# Patient Record
Sex: Male | Born: 1975 | Race: Black or African American | Hispanic: No | Marital: Married | Smoking: Never smoker
Health system: Southern US, Community
[De-identification: ages and names within clinical notes are randomized; demographics above are authoritative.]

## PROBLEM LIST (undated history)

## (undated) DIAGNOSIS — I10 Essential (primary) hypertension: Secondary | ICD-10-CM

## (undated) DIAGNOSIS — E119 Type 2 diabetes mellitus without complications: Secondary | ICD-10-CM

## (undated) DIAGNOSIS — G4730 Sleep apnea, unspecified: Secondary | ICD-10-CM

## (undated) DIAGNOSIS — I639 Cerebral infarction, unspecified: Secondary | ICD-10-CM

## (undated) HISTORY — DX: Type 2 diabetes mellitus without complications: E11.9

## (undated) HISTORY — PX: VASCULAR SURGERY: SHX849

## (undated) HISTORY — PX: WISDOM TOOTH EXTRACTION: SHX21

---

## 2004-08-05 ENCOUNTER — Ambulatory Visit: Payer: Self-pay | Admitting: Internal Medicine

## 2007-11-18 ENCOUNTER — Emergency Department (HOSPITAL_COMMUNITY): Admission: EM | Admit: 2007-11-18 | Discharge: 2007-11-18 | Payer: Self-pay | Admitting: Emergency Medicine

## 2009-01-01 ENCOUNTER — Emergency Department: Payer: Self-pay | Admitting: Emergency Medicine

## 2009-06-27 ENCOUNTER — Emergency Department: Payer: Self-pay | Admitting: Emergency Medicine

## 2009-09-05 DIAGNOSIS — I639 Cerebral infarction, unspecified: Secondary | ICD-10-CM

## 2009-09-05 HISTORY — DX: Cerebral infarction, unspecified: I63.9

## 2010-06-27 ENCOUNTER — Emergency Department: Payer: Self-pay | Admitting: Emergency Medicine

## 2010-09-29 ENCOUNTER — Encounter
Admission: RE | Admit: 2010-09-29 | Discharge: 2010-09-29 | Payer: Self-pay | Source: Home / Self Care | Attending: Unknown Physician Specialty | Admitting: Unknown Physician Specialty

## 2011-05-30 LAB — I-STAT 8, (EC8 V) (CONVERTED LAB)
BUN: 11
Chloride: 105
HCT: 47
Hemoglobin: 16
Operator id: 222501
pCO2, Ven: 52.1 — ABNORMAL HIGH

## 2011-05-30 LAB — POCT I-STAT CREATININE
Creatinine, Ser: 1.2
Operator id: 222501

## 2011-05-30 LAB — POCT CARDIAC MARKERS: CKMB, poc: <1 — ABNORMAL LOW

## 2012-05-27 ENCOUNTER — Emergency Department: Payer: Self-pay | Admitting: Emergency Medicine

## 2014-11-14 ENCOUNTER — Emergency Department: Payer: Self-pay | Admitting: Emergency Medicine

## 2015-10-30 ENCOUNTER — Emergency Department
Admission: EM | Admit: 2015-10-30 | Discharge: 2015-10-30 | Disposition: A | Discharge status: Home / Self Care | Payer: 59 | Attending: Emergency Medicine | Admitting: Emergency Medicine

## 2015-10-30 ENCOUNTER — Encounter: Payer: Self-pay | Admitting: Emergency Medicine

## 2015-10-30 ENCOUNTER — Emergency Department: Payer: 59

## 2015-10-30 DIAGNOSIS — R197 Diarrhea, unspecified: Secondary | ICD-10-CM | POA: Diagnosis present

## 2015-10-30 DIAGNOSIS — J069 Acute upper respiratory infection, unspecified: Secondary | ICD-10-CM | POA: Insufficient documentation

## 2015-10-30 DIAGNOSIS — I10 Essential (primary) hypertension: Secondary | ICD-10-CM | POA: Insufficient documentation

## 2015-10-30 DIAGNOSIS — A0811 Acute gastroenteropathy due to Norwalk agent: Secondary | ICD-10-CM | POA: Insufficient documentation

## 2015-10-30 DIAGNOSIS — R112 Nausea with vomiting, unspecified: Secondary | ICD-10-CM

## 2015-10-30 HISTORY — DX: Cerebral infarction, unspecified: I63.9

## 2015-10-30 HISTORY — DX: Essential (primary) hypertension: I10

## 2015-10-30 LAB — RAPID INFLUENZA A&B ANTIGENS (ARMC ONLY): INFLUENZA B (ARMC): NOT DETECTED

## 2015-10-30 LAB — BASIC METABOLIC PANEL
ANION GAP: 8 (ref 5–15)
BUN: 17 mg/dL (ref 6–20)
CALCIUM: 8.3 mg/dL — AB (ref 8.9–10.3)
CHLORIDE: 101 mmol/L (ref 101–111)
CO2: 29 mmol/L (ref 22–32)
CREATININE: 1.05 mg/dL (ref 0.61–1.24)
GFR calc non Af Amer: >60 mL/min (ref >60)
Glucose, Bld: 102 mg/dL — ABNORMAL HIGH (ref 65–99)
Potassium: 3.3 mmol/L — ABNORMAL LOW (ref 3.5–5.1)
SODIUM: 138 mmol/L (ref 135–145)

## 2015-10-30 LAB — CBC
HCT: 39.1 % — ABNORMAL LOW (ref 40.0–52.0)
HEMOGLOBIN: 12.6 g/dL — AB (ref 13.0–18.0)
MCH: 22.3 pg — ABNORMAL LOW (ref 26.0–34.0)
MCHC: 32.2 g/dL (ref 32.0–36.0)
MCV: 69.4 fL — ABNORMAL LOW (ref 80.0–100.0)
PLATELETS: 225 10*3/uL (ref 150–440)
RBC: 5.63 MIL/uL (ref 4.40–5.90)
RDW: 14.8 % — ABNORMAL HIGH (ref 11.5–14.5)
WBC: 5.8 10*3/uL (ref 3.8–10.6)

## 2015-10-30 LAB — RAPID INFLUENZA A&B ANTIGENS: Influenza A (ARMC): NOT DETECTED

## 2015-10-30 LAB — TROPONIN I

## 2015-10-30 MED ORDER — ONDANSETRON 4 MG PO TBDP
4.0000 mg | ORAL_TABLET | Freq: Four times a day (QID) | ORAL | Status: DC | PRN
Start: 2015-10-30 — End: 2018-07-13

## 2015-10-30 MED ORDER — ONDANSETRON 8 MG PO TBDP
8.0000 mg | ORAL_TABLET | Freq: Once | ORAL | Status: AC
  Administered 2015-10-30: 8 mg via ORAL
  Filled 2015-10-30: qty 1

## 2015-10-30 NOTE — ED Notes (Signed)
Pt here with c/o diarrhea, fever, cough with occasional cp that began yesterday. Pt states he has "twinges" of left sided cp when he begins coughing. Pt appears in no distress. VSS.

## 2015-10-30 NOTE — Discharge Instructions (Signed)
Cough, Adult A cough helps to clear your throat and lungs. A cough may last only 2-3 weeks (acute), or it may last longer than 8 weeks (chronic). Many different things can cause a cough. A cough may be a sign of an illness or another medical condition. HOME CARE  Pay attention to any changes in your cough.  Take medicines only as told by your doctor.  If you were prescribed an antibiotic medicine, take it as told by your doctor. Do not stop taking it even if you start to feel better.  Talk with your doctor before you try using a cough medicine.  Drink enough fluid to keep your pee (urine) clear or pale yellow.  If the air is dry, use a cold steam vaporizer or humidifier in your home.  Stay away from things that make you cough at work or at home.  If your cough is worse at night, try using extra pillows to raise your head up higher while you sleep.  Do not smoke, and try not to be around smoke. If you need help quitting, ask your doctor.  Do not have caffeine.  Do not drink alcohol.  Rest as needed. GET HELP IF:  You have new problems (symptoms).  You cough up yellow fluid (pus).  Your cough does not get better after 2-3 weeks, or your cough gets worse.  Medicine does not help your cough and you are not sleeping well.  You have pain that gets worse or pain that is not helped with medicine.  You have a fever.  You are losing weight and you do not know why.  You have night sweats. GET HELP RIGHT AWAY IF:  You cough up blood.  You have trouble breathing.  Your heartbeat is very fast.   This information is not intended to replace advice given to you by your health care provider. Make sure you discuss any questions you have with your health care provider.   Document Released: 05/05/2011 Document Revised: 05/13/2015 Document Reviewed: 10/29/2014 Elsevier Interactive Patient Education 2016 [employer]   Viral Gastroenteritis Viral gastroenteritis is also called  stomach flu. This illness is caused by a certain type of germ (virus). It can cause sudden watery poop (diarrhea) and throwing up (vomiting). This can cause you to lose body fluids (dehydration). This illness usually lasts for 3 to 8 days. It usually goes away on its own. HOME CARE   Drink enough fluids to keep your pee (urine) clear or pale yellow. Drink small amounts of fluids often.  Ask your doctor how to replace body fluid losses (rehydration).  Avoid:  Foods high in sugar.  Alcohol.  Bubbly (carbonated) drinks.  Tobacco.  Juice.  Caffeine drinks.  Very hot or cold fluids.  Fatty, greasy foods.  Eating too much at one time.  Dairy products until 24 to 48 hours after your watery poop stops.  You may eat foods with active cultures (probiotics). They can be found in some yogurts and supplements.  Wash your hands well to avoid spreading the illness.  Only take medicines as told by your doctor. Do not give aspirin to children. Do not take medicines for watery poop (antidiarrheals).  Ask your doctor if you should keep taking your regular medicines.  Keep all doctor visits as told. GET HELP RIGHT AWAY IF:   You cannot keep fluids down.  You do not pee at least once every 6 to 8 hours.  You are short of breath.  You see  blood in your poop or throw up. This may look like coffee grounds.  You have belly (abdominal) pain that gets worse or is just in one small spot (localized).  You keep throwing up or having watery poop.  You have a fever.  The patient is a child younger than 3 months, and he or she has a fever.  The patient is a child older than 3 months, and he or she has a fever and problems that do not go away.  The patient is a child older than 3 months, and he or she has a fever and problems that suddenly get worse.  The patient is a baby, and he or she has no tears when crying. MAKE SURE YOU:   Understand these instructions.  Will watch your  condition.  Will get help right away if you are not doing well or get worse.   This information is not intended to replace advice given to you by your health care provider. Make sure you discuss any questions you have with your health care provider.   Document Released: 02/08/2008 Document Revised: 11/14/2011 Document Reviewed: 06/08/2011 Elsevier Interactive Patient Education [employer]   Your exam is consistent withi a viral stomach flu. You should take the prescription medicine for nausea as needed. Continue to promote fluids to prevent dehydration. Follow-up with [facility] or your provider as needed.

## 2015-10-30 NOTE — ED Notes (Signed)
Discharge instructions reviewed with patient. Patient verbalized understanding. Patient ambulated to lobby without difficulty.   

## 2015-10-30 NOTE — ED Provider Notes (Signed)
[facility] Emergency Department Provider Note ____________________________________________  Time seen: 0909  I have reviewed the triage vital signs and the nursing notes.  HISTORY  Chief Complaint  Diarrhea; Chest Pain; and Fever  HPI Jesus Travis is a 40 y.o. male presents to the ED for evaluation of nausea, vomiting, and diarrhea with onset yesterday. He reports sick contacts at work with similar symptoms. He left work yesterday secondary to increased malaise and on Wednesday noticed a Tmax of 102.23F.He's been dosing Coricidin HBP for symptom relief and fever management. He noticed again elevated fever yesterday of 101F. Since onset he's had a difficult keeping fluids and solids down, including water. He is also noticed some discomfort to his chest associated with this cough and abdominal cramping. He notes his discomfort and pain at a 6/10 in triage. He denies any rashes, shortness of breath, or wheezing.  Past Medical History  Diagnosis Date  . CVA (cerebral infarction) 2011    mild   . Hypertension    There are no active problems to display for this patient.  History reviewed. No pertinent past surgical history.  Current Outpatient Rx  Name  Route  Sig  Dispense  Refill  . ondansetron (ZOFRAN ODT) 4 MG disintegrating tablet   Oral   Take 1 tablet (4 mg total) by mouth every 6 (six) hours as needed for nausea or vomiting.   15 tablet   0    Allergies Review of patient's allergies indicates no known allergies.  No family history on file.  Social History Social History  Substance Use Topics  . Smoking status: Never Smoker   . Smokeless tobacco: None  . Alcohol Use: No   Review of Systems  Constitutional: Positive for fever. Eyes: Negative for visual changes. ENT: Negative for sore throat. Cardiovascular: Positive for chest pain. Respiratory: Negative for shortness of breath. Reports cough. Gastrointestinal: Positive for abdominal  pain, vomiting and diarrhea. Genitourinary: Negative for dysuria. Musculoskeletal: Negative for back pain. Skin: Negative for rash. Neurological: Negative for headaches, focal weakness or numbness. ____________________________________________  PHYSICAL EXAM:  VITAL SIGNS: ED Triage Vitals  Enc Vitals Group     BP 10/30/15 0744 131/74 mmHg     Pulse Rate 10/30/15 0744 97     Resp 10/30/15 0744 18     Temp 10/30/15 0744 99 F (37.2 C)     Temp src --      SpO2 10/30/15 0744 99 %     Weight 10/30/15 0744 240 lb (108.863 kg)     Height 10/30/15 0744 6' (1.829 m)     Head Cir --      Peak Flow --      Pain Score 10/30/15 0744 5     Pain Loc --      Pain Edu? --      Excl. in GC? --    Constitutional: Alert and oriented. Well appearing and in no distress. Head: Normocephalic and atraumatic.      Eyes: Conjunctivae are normal. PERRL. Normal extraocular movements      Ears: Canals clear. TMs intact bilaterally.   Nose: No congestion/rhinorrhea.   Mouth/Throat: Mucous membranes are moist.   Neck: Supple. No thyromegaly. Hematological/Lymphatic/Immunological: No cervical lymphadenopathy. Cardiovascular: Normal rate, regular rhythm.  Respiratory: Normal respiratory effort. No wheezes/rales/rhonchi. Gastrointestinal: Soft and nontender. No distention, rebound, guarding, or organomegaly. Hyperactive bowel sounds. Musculoskeletal: Nontender with normal range of motion in all extremities.  Neurologic:  Normal gait without ataxia. Normal speech and  language. No gross focal neurologic deficits are appreciated. Skin:  Skin is warm, dry and intact. No rash noted. Psychiatric: Mood and affect are normal. Patient exhibits appropriate insight and judgment. ____________________________________________   LABS (pertinent positives/negatives) Labs Reviewed  BASIC METABOLIC PANEL - Abnormal; Notable for the following:    Potassium 3.3 (*)    Glucose, Bld 102 (*)    Calcium 8.3 (*)     All other components within normal limits  CBC - Abnormal; Notable for the following:    Hemoglobin 12.6 (*)    HCT 39.1 (*)    MCV 69.4 (*)    MCH 22.3 (*)    RDW 14.8 (*)    All other components within normal limits  RAPID INFLUENZA A&B ANTIGENS ([facility] ONLY)  TROPONIN I  ____________________________________________  ED ECG REPORT   Date: 10/30/2015  EKG Time: 3:16 PM  Rate: 105  Rhythm: sinus tachycardia,  normal EKG, there are no previous tracings available for comparison  Intervals:none  ST&T Change: none   Narrative Interpretation: sinus tachy, No STEMI ____________________________________________   RADIOLOGY  CXR IMPRESSION: No edema or consolidation. ____________________________________________  PROCEDURES  Zofran 8 mg ODT ____________________________________________  INITIAL IMPRESSION / ASSESSMENT AND PLAN / ED COURSE  Patient with what appears to be an acute viral gastroenteritis that may be also experiencing a viral upper extremity infection. He is found to have a negative influenza screen on exam today. History point is also negative at this time. He appears to have a musculoskeletal chest wall pain that is aggravated by his cough. Patient is otherwise found to have stable vital signs and essentially normal exam today. EKG does not reveal any signs of a STEMI. Patient is discharged with prescription for Zofran to dose as needed for nausea and vomiting. He is also advised to dose over-the-counter medications for cough and congestion relief. A work note is provided for today as requested. He is encouraged to continue to hydrate and return to the ED for intractable symptoms as discussed. ____________________________________________  FINAL CLINICAL IMPRESSION(S) / ED DIAGNOSES  Final diagnoses:  URI (upper respiratory infection)  Nausea vomiting and diarrhea  Viral gastroenteritis due to [place]-like agent      Lissa Hoard, PA-C 10/30/15  1523  Jennye Moccasin, MD 10/30/15 [id number]

## 2016-03-16 ENCOUNTER — Encounter: Payer: Self-pay | Admitting: *Deleted

## 2016-03-16 ENCOUNTER — Emergency Department: Payer: 59

## 2016-03-16 ENCOUNTER — Emergency Department
Admission: EM | Admit: 2016-03-16 | Discharge: 2016-03-16 | Disposition: A | Discharge status: Home / Self Care | Payer: 59 | Attending: Emergency Medicine | Admitting: Emergency Medicine

## 2016-03-16 DIAGNOSIS — Z8673 Personal history of transient ischemic attack (TIA), and cerebral infarction without residual deficits: Secondary | ICD-10-CM | POA: Diagnosis not present

## 2016-03-16 DIAGNOSIS — R079 Chest pain, unspecified: Secondary | ICD-10-CM

## 2016-03-16 DIAGNOSIS — I10 Essential (primary) hypertension: Secondary | ICD-10-CM | POA: Insufficient documentation

## 2016-03-16 DIAGNOSIS — R0789 Other chest pain: Secondary | ICD-10-CM | POA: Insufficient documentation

## 2016-03-16 DIAGNOSIS — Z79899 Other long term (current) drug therapy: Secondary | ICD-10-CM | POA: Diagnosis not present

## 2016-03-16 LAB — BASIC METABOLIC PANEL
Anion gap: 7 (ref 5–15)
BUN: 12 mg/dL (ref 6–20)
CHLORIDE: 104 mmol/L (ref 101–111)
CO2: 28 mmol/L (ref 22–32)
Calcium: 9.3 mg/dL (ref 8.9–10.3)
Creatinine, Ser: 0.88 mg/dL (ref 0.61–1.24)
GFR calc Af Amer: >60 mL/min (ref >60)
GLUCOSE: 90 mg/dL (ref 65–99)
POTASSIUM: 3.4 mmol/L — AB (ref 3.5–5.1)
Sodium: 139 mmol/L (ref 135–145)

## 2016-03-16 LAB — CBC
HEMATOCRIT: 41.6 % (ref 40.0–52.0)
Hemoglobin: 13.5 g/dL (ref 13.0–18.0)
MCH: 22.5 pg — AB (ref 26.0–34.0)
MCHC: 32.4 g/dL (ref 32.0–36.0)
MCV: 69.4 fL — AB (ref 80.0–100.0)
Platelets: 269 10*3/uL (ref 150–440)
RBC: 5.99 MIL/uL — ABNORMAL HIGH (ref 4.40–5.90)
RDW: 14.7 % — AB (ref 11.5–14.5)
WBC: 4.7 10*3/uL (ref 3.8–10.6)

## 2016-03-16 LAB — TROPONIN I: Troponin I: <0.03 ng/mL (ref <0.03)

## 2016-03-16 MED ORDER — FAMOTIDINE 20 MG PO TABS: 20.0000 mg | ORAL_TABLET | Freq: Two times a day (BID) | ORAL | Status: DC

## 2016-03-16 NOTE — ED Notes (Signed)
Pt verbalized understanding of discharge instructions. NAD at this time. 

## 2016-03-16 NOTE — ED Notes (Signed)
Pt  Sent from [facility], pt has had chest pain for the last week, pain occurs after eating and lying down

## 2016-03-16 NOTE — ED Provider Notes (Signed)
[facility] Emergency Department Provider Note        Time seen: ----------------------------------------- 12:09 PM on 03/16/2016 -----------------------------------------    I have reviewed the triage vital signs and the nursing notes.   HISTORY  Chief Complaint Chest Pain    HPI Jesus Travis is a 40 y.o. male who presents to ER for chest pain for the last week. Patient states pain occurs after laying down and eating. He does have some tenderness to his left chest wall. Patient states she's been seen in the ER for this in the distant past. He denies recent illness or other complaints at this time.   Past Medical History  Diagnosis Date  . CVA (cerebral infarction) 2011    mild   . Hypertension     There are no active problems to display for this patient.   History reviewed. No pertinent past surgical history.  Allergies Review of patient's allergies indicates no known allergies.  Social History Social History  Substance Use Topics  . Smoking status: Never Smoker   . Smokeless tobacco: None  . Alcohol Use: No    Review of Systems Constitutional: Negative for fever. Cardiovascular: Positive for chest pain Respiratory: Negative for shortness of breath. Gastrointestinal: Negative for abdominal pain, vomiting and diarrhea. Genitourinary: Negative for dysuria. Musculoskeletal: Negative for back pain. Skin: Negative for rash. Neurological: Negative for headaches, focal weakness or numbness.  10-point ROS otherwise negative.  ____________________________________________   PHYSICAL EXAM:  VITAL SIGNS: ED Triage Vitals  Enc Vitals Group     BP --      Pulse --      Resp --      Temp --      Temp src --      SpO2 --      Weight --      Height --      Head Cir --      Peak Flow --      Pain Score 03/16/16 1147 1     Pain Loc --      Pain Edu? --      Excl. in GC? --    Constitutional: Alert and oriented. Well  appearing and in no distress. Eyes: Conjunctivae are normal. PERRL. Normal extraocular movements. ENT   Head: Normocephalic and atraumatic.   Nose: No congestion/rhinnorhea.   Mouth/Throat: Mucous membranes are moist.   Neck: No stridor. Cardiovascular: Normal rate, regular rhythm. No murmurs, rubs, or gallops. Respiratory: Normal respiratory effort without tachypnea nor retractions. Breath sounds are clear and equal bilaterally. No wheezes/rales/rhonchi. Gastrointestinal: Soft and nontender. Normal bowel sounds Musculoskeletal: Nontender with normal range of motion in all extremities. No lower extremity tenderness nor edema. Neurologic:  Normal speech and language. No gross focal neurologic deficits are appreciated.  Skin:  Skin is warm, dry and intact. No rash noted. Psychiatric: Mood and affect are normal. Speech and behavior are normal.  ____________________________________________  EKG: Interpreted by me. Sinus rhythm with a rate of 78 bpm, normal PR interval, normal QRS, normal QT interval. Normal axis.  ____________________________________________  ED COURSE:  Pertinent labs & imaging results that were available during my care of the patient were reviewed by me and considered in my medical decision making (see chart for details). Patient is in no acute distress, will check basic labs and imaging. Likely GERD symptoms mostly. ____________________________________________    LABS (pertinent positives/negatives)  Labs Reviewed  BASIC METABOLIC PANEL - Abnormal; Notable for the following:  Potassium 3.4 (*)    All other components within normal limits  CBC - Abnormal; Notable for the following:    RBC 5.99 (*)    MCV 69.4 (*)    MCH 22.5 (*)    RDW 14.7 (*)    All other components within normal limits  TROPONIN I    RADIOLOGY  Chest x-ray Is unremarkable ____________________________________________  FINAL ASSESSMENT AND PLAN  Chest pain  Plan:  Patient with labs and imaging as dictated above. Unclear etiology for his symptoms. Much of his symptoms sound GERD related. He'll be placed on Pepcid and encouraged to have close follow-up with cardiology for clearance from a cardiac perspective.   Emily Filbert, MD   Note: This dictation was prepared with Dragon dictation. Any transcriptional errors that result from this process are unintentional   Emily Filbert, MD 03/16/16 1250

## 2016-03-16 NOTE — Discharge Instructions (Signed)
Nonspecific Chest Pain  °Chest pain can be caused by many different conditions. There is always a chance that your pain could be related to something serious, such as a heart attack or a blood clot in your lungs. Chest pain can also be caused by conditions that are not life-threatening. If you have chest pain, it is very important to follow up with your health care provider. °CAUSES  °Chest pain can be caused by: °· Heartburn. °· Pneumonia or bronchitis. °· Anxiety or stress. °· Inflammation around your heart (pericarditis) or lung (pleuritis or pleurisy). °· A blood clot in your lung. °· A collapsed lung (pneumothorax). It can develop suddenly on its own (spontaneous pneumothorax) or from trauma to the chest. °· Shingles infection (varicella-zoster virus). °· Heart attack. °· Damage to the bones, muscles, and cartilage that make up your chest wall. This can include: °· Bruised bones due to injury. °· Strained muscles or cartilage due to frequent or repeated coughing or overwork. °· Fracture to one or more ribs. °· Sore cartilage due to inflammation (costochondritis). °RISK FACTORS  °Risk factors for chest pain may include: °· Activities that increase your risk for trauma or injury to your chest. °· Respiratory infections or conditions that cause frequent coughing. °· Medical conditions or overeating that can cause heartburn. °· Heart disease or family history of heart disease. °· Conditions or health behaviors that increase your risk of developing a blood clot. °· Having had chicken pox (varicella zoster). °SIGNS AND SYMPTOMS °Chest pain can feel like: °· Burning or tingling on the surface of your chest or deep in your chest. °· Crushing, pressure, aching, or squeezing pain. °· Dull or sharp pain that is worse when you move, cough, or take a deep breath. °· Pain that is also felt in your back, neck, shoulder, or arm, or pain that spreads to any of these areas. °Your chest pain may come and go, or it may stay  constant. °DIAGNOSIS °Lab tests or other studies may be needed to find the cause of your pain. Your health care provider may have you take a test called an ambulatory ECG (electrocardiogram). An ECG records your heartbeat patterns at the time the test is performed. You may also have other tests, such as: °· Transthoracic echocardiogram (TTE). During echocardiography, sound waves are used to create a picture of all of the heart structures and to look at how blood flows through your heart. °· Transesophageal echocardiogram (TEE). This is a more advanced imaging test that obtains images from inside your body. It allows your health care provider to see your heart in finer detail. °· Cardiac monitoring. This allows your health care provider to monitor your heart rate and rhythm in real time. °· Holter monitor. This is a portable device that records your heartbeat and can help to diagnose abnormal heartbeats. It allows your health care provider to track your heart activity for several days, if needed. °· Stress tests. These can be done through exercise or by taking medicine that makes your heart beat more quickly. °· Blood tests. °· Imaging tests. °TREATMENT  °Your treatment depends on what is causing your chest pain. Treatment may include: °· Medicines. These may include: °· Acid blockers for heartburn. °· Anti-inflammatory medicine. °· Pain medicine for inflammatory conditions. °· Antibiotic medicine, if an infection is present. °· Medicines to dissolve blood clots. °· Medicines to treat coronary artery disease. °· Supportive care for conditions that do not require medicines. This may include: °· Resting. °· Applying heat   or cold packs to injured areas. °· Limiting activities until pain decreases. °HOME CARE INSTRUCTIONS °· If you were prescribed an antibiotic medicine, finish it all even if you start to feel better. °· Avoid any activities that bring on chest pain. °· Do not use any tobacco products, including  cigarettes, chewing tobacco, or electronic cigarettes. If you need help quitting, ask your health care provider. °· Do not drink alcohol. °· Take medicines only as directed by your health care provider. °· Keep all follow-up visits as directed by your health care provider. This is important. This includes any further testing if your chest pain does not go away. °· If heartburn is the cause for your chest pain, you may be told to keep your head raised (elevated) while sleeping. This reduces the chance that acid will go from your stomach into your esophagus. °· Make lifestyle changes as directed by your health care provider. These may include: °¨ Getting regular exercise. Ask your health care provider to suggest some activities that are safe for you. °¨ Eating a heart-healthy diet. A registered dietitian can help you to learn healthy eating options. °¨ Maintaining a healthy weight. °¨ Managing diabetes, if necessary. °¨ Reducing stress. °SEEK MEDICAL CARE IF: °· Your chest pain does not go away after treatment. °· You have a rash with blisters on your chest. °· You have a fever. °SEEK IMMEDIATE MEDICAL CARE IF:  °· Your chest pain is worse. °· You have an increasing cough, or you cough up blood. °· You have severe abdominal pain. °· You have severe weakness. °· You faint. °· You have chills. °· You have sudden, unexplained chest discomfort. °· You have sudden, unexplained discomfort in your arms, back, neck, or jaw. °· You have shortness of breath at any time. °· You suddenly start to sweat, or your skin gets clammy. °· You feel nauseous or you vomit. °· You suddenly feel light-headed or dizzy. °· Your heart begins to beat quickly, or it feels like it is skipping beats. °These symptoms may represent a serious problem that is an emergency. Do not wait to see if the symptoms will go away. Get medical help right away. Call your local emergency services (911 in the [place] Do not drive yourself to the hospital. °  °This  information is not intended to replace advice given to you by your health care provider. Make sure you discuss any questions you have with your health care provider. °  °Document Released: 06/01/2005 Document Revised: 09/12/2014 Document Reviewed: 03/28/2014 °Elsevier Interactive Patient Education ©2016 [employer] ° °Gastroesophageal Reflux Disease, Adult °Normally, food travels down the esophagus and stays in the stomach to be digested. However, when a person has gastroesophageal reflux disease (GERD), food and stomach acid move back up into the esophagus. When this happens, the esophagus becomes sore and inflamed. Over time, GERD can create small holes (ulcers) in the lining of the esophagus.  °CAUSES °This condition is caused by a problem with the muscle between the esophagus and the stomach (lower esophageal sphincter, or LES). Normally, the LES muscle closes after food passes through the esophagus to the stomach. When the LES is weakened or abnormal, it does not close properly, and that allows food and stomach acid to go back up into the esophagus. The LES can be weakened by certain dietary substances, medicines, and medical conditions, including: °· Tobacco use. °· Pregnancy. °· Having a hiatal hernia. °· Heavy alcohol use. °· Certain foods and beverages, such as coffee,   chocolate, onions, and peppermint. °RISK FACTORS °This condition is more likely to develop in: °· People who have an increased body weight. °· People who have connective tissue disorders. °· People who use NSAID medicines. °SYMPTOMS °Symptoms of this condition include: °· Heartburn. °· Difficult or painful swallowing. °· The feeling of having a lump in the throat. °· A bitter taste in the mouth. °· Bad breath. °· Having a large amount of saliva. °· Having an upset or bloated stomach. °· Belching. °· Chest pain. °· Shortness of breath or wheezing. °· Ongoing (chronic) cough or a night-time cough. °· Wearing away of tooth enamel. °· Weight  loss. °Different conditions can cause chest pain. Make sure to see your health care provider if you experience chest pain. °DIAGNOSIS °Your health care provider will take a medical history and perform a physical exam. To determine if you have mild or severe GERD, your health care provider may also monitor how you respond to treatment. You may also have other tests, including: °· An endoscopy to examine your stomach and esophagus with a small camera. °· A test that measures the acidity level in your esophagus. °· A test that measures how much pressure is on your esophagus. °· A barium swallow or modified barium swallow to show the shape, size, and functioning of your esophagus. °TREATMENT °The goal of treatment is to help relieve your symptoms and to prevent complications. Treatment for this condition may vary depending on how severe your symptoms are. Your health care provider may recommend: °· Changes to your diet. °· Medicine. °· Surgery. °HOME CARE INSTRUCTIONS °Diet °· Follow a diet as recommended by your health care provider. This may involve avoiding foods and drinks such as: °¨ Coffee and tea (with or without caffeine). °¨ Drinks that contain alcohol. °¨ Energy drinks and sports drinks. °¨ Carbonated drinks or sodas. °¨ Chocolate and cocoa. °¨ Peppermint and mint flavorings. °¨ Garlic and onions. °¨ Horseradish. °¨ Spicy and acidic foods, including peppers, chili powder, curry powder, vinegar, hot sauces, and barbecue sauce. °¨ Citrus fruit juices and citrus fruits, such as oranges, lemons, and limes. °¨ Tomato-based foods, such as red sauce, chili, salsa, and pizza with red sauce. °¨ Fried and fatty foods, such as donuts, french fries, potato chips, and high-fat dressings. °¨ High-fat meats, such as hot dogs and fatty cuts of red and white meats, such as rib eye steak, sausage, ham, and bacon. °¨ High-fat dairy items, such as whole milk, butter, and cream cheese. °· Eat small, frequent meals instead of large  meals. °· Avoid drinking large amounts of liquid with your meals. °· Avoid eating meals during the 2-3 hours before bedtime. °· Avoid lying down right after you eat. °· Do not exercise right after you eat. ° General Instructions  °· Pay attention to any changes in your symptoms. °· Take over-the-counter and prescription medicines only as told by your health care provider. Do not take aspirin, ibuprofen, or other NSAIDs unless your health care provider told you to do so. °· Do not use any tobacco products, including cigarettes, chewing tobacco, and e-cigarettes. If you need help quitting, ask your health care provider. °· Wear loose-fitting clothing. Do not wear anything tight around your waist that causes pressure on your abdomen. °· Raise (elevate) the head of your bed 6 inches (15cm). °· Try to reduce your stress, such as with yoga or meditation. If you need help reducing stress, ask your health care provider. °· If you are overweight, reduce your weight to an amount that is   healthy for you. Ask your health care provider for guidance about a safe weight loss goal. °· Keep all follow-up visits as told by your health care provider. This is important. °SEEK MEDICAL CARE IF: °· You have new symptoms. °· You have unexplained weight loss. °· You have difficulty swallowing, or it hurts to swallow. °· You have wheezing or a persistent cough. °· Your symptoms do not improve with treatment. °· You have a hoarse voice. °SEEK IMMEDIATE MEDICAL CARE IF: °· You have pain in your arms, neck, jaw, teeth, or back. °· You feel sweaty, dizzy, or light-headed. °· You have chest pain or shortness of breath. °· You vomit and your vomit looks like blood or coffee grounds. °· You faint. °· Your stool is bloody or black. °· You cannot swallow, drink, or eat. °  °This information is not intended to replace advice given to you by your health care provider. Make sure you discuss any questions you have with your health care provider. °    °Document Released: 06/01/2005 Document Revised: 05/13/2015 Document Reviewed: 12/17/2014 °Elsevier Interactive Patient Education ©2016 [employer] ° °

## 2017-06-22 ENCOUNTER — Encounter: Payer: Self-pay | Admitting: Emergency Medicine

## 2017-06-22 ENCOUNTER — Emergency Department
Admission: EM | Admit: 2017-06-22 | Discharge: 2017-06-22 | Disposition: A | Discharge status: Home / Self Care | Payer: 59 | Attending: Emergency Medicine | Admitting: Emergency Medicine

## 2017-06-22 ENCOUNTER — Emergency Department: Payer: 59

## 2017-06-22 DIAGNOSIS — Z79899 Other long term (current) drug therapy: Secondary | ICD-10-CM | POA: Diagnosis not present

## 2017-06-22 DIAGNOSIS — R0789 Other chest pain: Secondary | ICD-10-CM | POA: Diagnosis not present

## 2017-06-22 DIAGNOSIS — R079 Chest pain, unspecified: Secondary | ICD-10-CM | POA: Diagnosis present

## 2017-06-22 DIAGNOSIS — Z8673 Personal history of transient ischemic attack (TIA), and cerebral infarction without residual deficits: Secondary | ICD-10-CM | POA: Diagnosis not present

## 2017-06-22 DIAGNOSIS — I10 Essential (primary) hypertension: Secondary | ICD-10-CM | POA: Insufficient documentation

## 2017-06-22 LAB — BASIC METABOLIC PANEL
ANION GAP: 10 (ref 5–15)
BUN: 24 mg/dL — AB (ref 6–20)
CALCIUM: 9.5 mg/dL (ref 8.9–10.3)
CO2: 29 mmol/L (ref 22–32)
Chloride: 101 mmol/L (ref 101–111)
Creatinine, Ser: 0.94 mg/dL (ref 0.61–1.24)
GFR calc Af Amer: >60 mL/min (ref >60)
Glucose, Bld: 89 mg/dL (ref 65–99)
POTASSIUM: 3.8 mmol/L (ref 3.5–5.1)
SODIUM: 140 mmol/L (ref 135–145)

## 2017-06-22 LAB — TROPONIN I

## 2017-06-22 LAB — CBC
HEMATOCRIT: 39.9 % — AB (ref 40.0–52.0)
Hemoglobin: 12.7 g/dL — ABNORMAL LOW (ref 13.0–18.0)
MCH: 22.4 pg — ABNORMAL LOW (ref 26.0–34.0)
MCHC: 31.9 g/dL — ABNORMAL LOW (ref 32.0–36.0)
MCV: 70.2 fL — ABNORMAL LOW (ref 80.0–100.0)
Platelets: 304 10*3/uL (ref 150–440)
RBC: 5.69 MIL/uL (ref 4.40–5.90)
RDW: 14.5 % (ref 11.5–14.5)
WBC: 5.1 10*3/uL (ref 3.8–10.6)

## 2017-06-22 MED ORDER — ASPIRIN 81 MG PO CHEW
162.0000 mg | CHEWABLE_TABLET | Freq: Once | ORAL | Status: AC
  Administered 2017-06-22: 162 mg via ORAL
  Filled 2017-06-22: qty 2

## 2017-06-22 NOTE — ED Provider Notes (Signed)
[facility] Emergency Department Provider Note  ____________________________________________   First MD Initiated Contact with Patient 06/22/17 1901     (approximate)  I have reviewed the triage vital signs and the nursing notes.   HISTORY  Chief Complaint Chest Pain    HPI Jesus Travis is a 41 y.o. male history of presents to the emergency department withseveral weeks of daily chest pain and some shortness of breath. The pain seems to be worse with deep inspiration is mild to moderate in his left chest. It is nonexertional. It does radiate to his left shoulder. He's never been nauseated. He has never become diaphoretic. He's had no leg swelling. No hemoptysis. No history of deep vein thrombosis or pulmonary embolism. He is not a smoker and he has never smoked. He does have a family history of hypertension.   Past Medical History:  Diagnosis Date  . CVA (cerebral infarction) 2011   mild   . Hypertension     There are no active problems to display for this patient.   No past surgical history on file.  Prior to Admission medications   Medication Sig Start Date End Date Taking? Authorizing Provider  lisinopril-hydrochlorothiazide (PRINZIDE,ZESTORETIC) 20-25 MG tablet Take 1 tablet by mouth daily. 06/19/17  Yes [provider]  sertraline (ZOLOFT) 25 MG tablet Take 25 mg by mouth daily. 12/22/16 12/22/17 Yes [provider]  famotidine (PEPCID) 20 MG tablet Take 1 tablet (20 mg total) by mouth 2 (two) times daily. Patient not taking: Reported on 06/22/2017 03/16/16   Emily FilbertWilliams, Jonathan E, MD  ondansetron (ZOFRAN ODT) 4 MG disintegrating tablet Take 1 tablet (4 mg total) by mouth every 6 (six) hours as needed for nausea or vomiting. Patient not taking: Reported on 06/22/2017 10/30/15   Menshew, Charlesetta IvoryJenise V Bacon, PA-C    Allergies Patient has no known allergies.  No family history on file.  Social History Social History    Substance Use Topics  . Smoking status: Never Smoker  . Smokeless tobacco: Not on file  . Alcohol use No    Review of Systems Constitutional: No fever/chills Eyes: No visual changes. ENT: No sore throat. Cardiovascular: positive for chest pain. Respiratory: Denies shortness of breath. Gastrointestinal: No abdominal pain.  No nausea, no vomiting.  No diarrhea.  No constipation. Genitourinary: Negative for dysuria. Musculoskeletal: Negative for back pain. Skin: Negative for rash. Neurological: Negative for headaches, focal weakness or numbness.   ____________________________________________   PHYSICAL EXAM:  VITAL SIGNS: ED Triage Vitals [06/22/17 1730]  Enc Vitals Group     BP 126/82     Pulse Rate 90     Resp 18     Temp 98.8 F (37.1 C)     Temp Source Oral     SpO2 100 %     Weight 235 lb (106.6 kg)     Height 6' (1.829 m)     Head Circumference      Peak Flow      Pain Score 4     Pain Loc      Pain Edu?      Excl. in GC?     Constitutional: alert and oriented 4 well appearing nontoxic no diaphoresis speaks in full sentences Eyes: PERRL EOMI. Head: Atraumatic. Nose: No congestion/rhinnorhea. Mouth/Throat: No trismus Neck: No stridor.   Cardiovascular: Normal rate, regular rhythm. Grossly normal heart sounds.  Good peripheral circulation. Respiratory: Normal respiratory effort.  No retractions. Lungs CTAB and moving good air Gastrointestinal:  soft nontender Musculoskeletal: No lower extremity edema   Neurologic:  Normal speech and language. No gross focal neurologic deficits are appreciated. Skin:  Skin is warm, dry and intact. No rash noted. Psychiatric: Mood and affect are normal. Speech and behavior are normal.    ____________________________________________   DIFFERENTIAL includes but not limited to  acute coronary syndrome, stable angina, pulmonary lives in, aortic dissection, pericarditis, musculoskeletal  pain ____________________________________________   LABS (all labs ordered are listed, but only abnormal results are displayed)  Labs Reviewed  BASIC METABOLIC PANEL - Abnormal; Notable for the following:       Result Value   BUN 24 (*)    All other components within normal limits  CBC - Abnormal; Notable for the following:    Hemoglobin 12.7 (*)    HCT 39.9 (*)    MCV 70.2 (*)    MCH 22.4 (*)    MCHC 31.9 (*)    All other components within normal limits  TROPONIN I  TROPONIN I    blood work reviewed and interpreted by me shows no acute ischemia 2 __________________________________________  EKG  ED ECG REPORT I, Merrily Brittle, the attending physician, personally viewed and interpreted this ECG.  Date: 06/22/2017 EKG Time:  Rate: 94 Rhythm: normal sinus rhythm QRS Axis: normal Intervals: normal ST/T Wave abnormalities: normal Narrative Interpretation: no evidence of acute ischemia  ____________________________________________  RADIOLOGY  chest x-ray reviewed by me shows no acute disease ____________________________________________   PROCEDURES  Procedure(s) performed: no  Procedures  Critical Care performed: no  Observation: yes  ----------------------------------------- 7:00 PM on 06/22/2017 -----------------------------------------   OBSERVATION CARE: This patient is being placed under observation care for the following reasons: Chest pain with repeat testing to rule out ischemia    ____________________________________________   INITIAL IMPRESSION / ASSESSMENT AND PLAN / ED COURSE  Pertinent labs & imaging results that were available during my care of the patient were reviewed by me and considered in my medical decision making (see chart for details).       The patient is very well-appearing with a heart score of 2 pending his second troponin.  ----------------------------------------- 20:08 PM on  06/22/2017 ----------------------------------------- The patient's pain is now resolved. Pending second troponin.  ____________________________________________  ----------------------------------------- 9:33 PM on 06/22/2017 -----------------------------------------   END OF OBSERVATION STATUS: After an appropriate period of observation, this patient is being discharged due to the following reason(s):  the patient's second troponin is negative and he has no signs of acute ischemia. His heart score is 2. He has already seen Dr. Welton Flakes in the past for an unrelated issue swell refer him back for further risk stratification. The patient verbalizes understanding and agreement with plan.   FINAL CLINICAL IMPRESSION(S) / ED DIAGNOSES  Final diagnoses:  Atypical chest pain      NEW MEDICATIONS STARTED DURING THIS VISIT:  Discharge Medication List as of 06/22/2017  9:33 PM       Note:  This document was prepared using Dragon voice recognition software and may include unintentional dictation errors.     Merrily Brittle, MD 06/23/17 [phone]

## 2017-06-22 NOTE — ED Triage Notes (Signed)
Pt reports left side chest pain that radiates to his back with associated SOB, dizziness, and lightheadedness that began today. Pt ambulatory to triage. No apparent distress noted.

## 2017-06-22 NOTE — Discharge Instructions (Signed)
Fortunately today your blood work was reassuring and he did not have a heart attack. This does not mean that this was not heart pain. Please take ibuprofen as needed for discomfort now but make sure you follow up with Dr. Welton Flakes of Cardiology for a recheck.  Return to the ED for any concerns.  It was a pleasure to take care of you today, and thank you for coming to our emergency department.  If you have any questions or concerns before leaving please ask the nurse to grab me and I'm more than happy to go through your aftercare instructions again.  If you were prescribed any opioid pain medication today such as Norco, Vicodin, Percocet, morphine, hydrocodone, or oxycodone please make sure you do not drive when you are taking this medication as it can alter your ability to drive safely.  If you have any concerns once you are home that you are not improving or are in fact getting worse before you can make it to your follow-up appointment, please do not hesitate to call 911 and come back for further evaluation.  Merrily Brittle, MD  Results for orders placed or performed during the hospital encounter of 06/22/17  Basic metabolic panel  Result Value Ref Range   Sodium 140 135 - 145 mmol/L   Potassium 3.8 3.5 - 5.1 mmol/L   Chloride 101 101 - 111 mmol/L   CO2 29 22 - 32 mmol/L   Glucose, Bld 89 65 - 99 mg/dL   BUN 24 (H) 6 - 20 mg/dL   Creatinine, Ser 8.11 0.61 - 1.24 mg/dL   Calcium 9.5 8.9 - 03.1 mg/dL   GFR calc non Af Amer >60 >60 mL/min   GFR calc Af Amer >60 >60 mL/min   Anion gap 10 5 - 15  CBC  Result Value Ref Range   WBC 5.1 3.8 - 10.6 K/uL   RBC 5.69 4.40 - 5.90 MIL/uL   Hemoglobin 12.7 (L) 13.0 - 18.0 g/dL   HCT 59.4 (L) 58.5 - 92.9 %   MCV 70.2 (L) 80.0 - 100.0 fL   MCH 22.4 (L) 26.0 - 34.0 pg   MCHC 31.9 (L) 32.0 - 36.0 g/dL   RDW 24.4 62.8 - 63.8 %   Platelets 304 150 - 440 K/uL  Troponin I  Result Value Ref Range   Troponin I <0.03 <0.03 ng/mL  Troponin I  Result Value  Ref Range   Troponin I <0.03 <0.03 ng/mL   Dg Chest 2 View  Result Date: 06/22/2017 CLINICAL DATA:  Chest pain EXAM: CHEST  2 VIEW COMPARISON:  03/16/2016 FINDINGS: The heart size and mediastinal contours are within normal limits. Both lungs are clear. The visualized skeletal structures are unremarkable. IMPRESSION: No active cardiopulmonary disease. Electronically Signed   By: Jasmine Pang M.D.   On: 06/22/2017 18:44

## 2017-09-11 ENCOUNTER — Encounter (INDEPENDENT_AMBULATORY_CARE_PROVIDER_SITE_OTHER): Payer: Self-pay | Admitting: Vascular Surgery

## 2017-09-11 ENCOUNTER — Ambulatory Visit (INDEPENDENT_AMBULATORY_CARE_PROVIDER_SITE_OTHER): Payer: 59 | Admitting: Vascular Surgery

## 2017-09-11 VITALS — BP 129/79 | HR 98 | Resp 17 | Ht 72.0 in | Wt 246.2 lb

## 2017-09-11 DIAGNOSIS — I10 Essential (primary) hypertension: Secondary | ICD-10-CM | POA: Diagnosis not present

## 2017-09-11 DIAGNOSIS — I83813 Varicose veins of bilateral lower extremities with pain: Secondary | ICD-10-CM

## 2017-09-11 DIAGNOSIS — R600 Localized edema: Secondary | ICD-10-CM | POA: Diagnosis not present

## 2017-09-11 NOTE — Progress Notes (Signed)
Subjective:    Patient ID: Jesus Travis, male    DOB: 16-Jul-1976, 42 y.o.   MRN: [id number] Chief Complaint  Patient presents with  . New Patient (Initial Visit)    ref Burnett Sheng for Varicose Vein   The patient presents as a new patient referred by Dr. Lockie Pares for evaluation of painful varicose veins.  The patient endorses a long-standing history of varicosities located to the bilateral lower extremity which caused him discomfort.  The patient notes this discomfort as a stinging and burning along his varicosities.  The patient also notes his varicosities have become bigger over time.  The patient also experiences bilateral lower extremity edema.  The patient notes his discomfort and edema worsens towards the end of the day or with prolonged standing or sitting.  The patient's symptoms have progressed to the point he is unable to function on a daily basis.  His symptoms have become lifestyle limiting this is what prompted him to seek medical attention.  The patient denies any trauma surgery to the bilateral lower extremity.  The patient's left lower extremity symptoms are worse in his right.  The patient denies any claudication-like symptoms, rest pain or ulceration to the lower extremities.  The patient does not engage in conservative therapy at this time.  The patient denies any fever, nausea or vomiting.   Review of Systems  Constitutional: Negative.   HENT: Negative.   Eyes: Negative.   Respiratory: Negative.   Cardiovascular:       Painful varicose veins Lateral lower extremity edema  Gastrointestinal: Negative.   Endocrine: Negative.   Genitourinary: Negative.   Musculoskeletal: Negative.   Skin: Negative.   Allergic/Immunologic: Negative.   Neurological: Negative.   Hematological: Negative.   Psychiatric/Behavioral: Negative.       Objective:   Physical Exam  Constitutional: He is oriented to person, place, and time. He appears well-developed and well-nourished. No  distress.  HENT:  Head: Normocephalic and atraumatic.  Eyes: Conjunctivae are normal. Pupils are equal, round, and reactive to light.  Neck: Normal range of motion.  Cardiovascular: Normal rate, regular rhythm, normal heart sounds and intact distal pulses.  Pulses:      Radial pulses are 2+ on the right side, and 2+ on the left side.       Dorsalis pedis pulses are 2+ on the right side, and 2+ on the left side.       Posterior tibial pulses are 2+ on the right side, and 2+ on the left side.  Pulmonary/Chest: Effort normal and breath sounds normal.  Musculoskeletal: Normal range of motion. He exhibits edema (Mild nonpitting edema noted bilaterally).  Neurological: He is alert and oriented to person, place, and time.  Skin: He is not diaphoretic.  Mixture of greater than and less than 1 cm varicosities noted to the bilateral lower extremity.  There is no stasis dermatitis, skin changes or cellulitis noted bilaterally.  Skin is intact.  Psychiatric: He has a normal mood and affect. His behavior is normal. Judgment and thought content normal.  Vitals reviewed.  BP 129/79 (BP Location: Right Arm)   Pulse 98   Resp 17   Ht 6' (1.829 m)   Wt 246 lb 3.2 oz (111.7 kg)   BMI 33.39 kg/m   Past Medical History:  Diagnosis Date  . CVA (cerebral infarction) 2011   mild   . Hypertension    Social History   Socioeconomic History  . Marital status: Married    Spouse  name: Not on file  . Number of children: Not on file  . Years of education: Not on file  . Highest education level: Not on file  Social Needs  . Financial resource strain: Not on file  . Food insecurity - worry: Not on file  . Food insecurity - inability: Not on file  . Transportation needs - medical: Not on file  . Transportation needs - non-medical: Not on file  Occupational History  . Not on file  Tobacco Use  . Smoking status: Never Smoker  . Smokeless tobacco: Never Used  Substance and Sexual Activity  . Alcohol  use: No  . Drug use: No  . Sexual activity: Not on file  Other Topics Concern  . Not on file  Social History Narrative  . Not on file   Past Surgical History:  Procedure Laterality Date  . WISDOM TOOTH EXTRACTION     Family History  Problem Relation Age of Onset  . Hypertension Mother   . Stroke Mother   . Hypertension Father   . Stroke Maternal Grandmother    No Known Allergies     Assessment & Plan:  The patient presents as a new patient referred by Dr. Lockie Pares for evaluation of painful varicose veins.  The patient endorses a long-standing history of varicosities located to the bilateral lower extremity which caused him discomfort.  The patient notes this discomfort as a stinging and burning along his varicosities.  The patient also notes his varicosities have become bigger over time.  The patient also experiences bilateral lower extremity edema.  The patient notes his discomfort and edema worsens towards the end of the day or with prolonged standing or sitting.  The patient's symptoms have progressed to the point he is unable to function on a daily basis.  His symptoms have become lifestyle limiting this is what prompted him to seek medical attention.  The patient denies any trauma surgery to the bilateral lower extremity.  The patient's left lower extremity symptoms are worse in his right.  The patient denies any claudication-like symptoms, rest pain or ulceration to the lower extremities.  The patient does not engage in conservative therapy at this time.  The patient denies any fever, nausea or vomiting.  1. Varicose veins of both lower extremities with pain - New The patient was encouraged to wear graduated compression stockings (20-30 mmHg) on a daily basis. The patient was instructed to begin wearing the stockings first thing in the morning and removing them in the evening. The patient was instructed specifically not to sleep in the stockings. Prescription given.  In addition,  behavioral modification including elevation during the day will be initiated. Anti-inflammatories for pain. Information on chronic venous insufficiency and compression stockings was given to the patient. The patient was instructed to call the office in the interim if any worsening edema or ulcerations to the legs, feet or toes occurs. The patient expresses their understanding.  - VAS [place] LOWER EXTREMITY VENOUS REFLUX; Future  2. Bilateral lower extremity edema - New As above  3. Essential hypertension - Stable Encouraged good control as its slows the progression of atherosclerotic disease  Current Outpatient Medications on File Prior to Visit  Medication Sig Dispense Refill  . lisinopril-hydrochlorothiazide (PRINZIDE,ZESTORETIC) 20-25 MG tablet Take 1 tablet by mouth daily.    . sertraline (ZOLOFT) 25 MG tablet Take 25 mg by mouth daily.    . famotidine (PEPCID) 20 MG tablet Take 1 tablet (20 mg total) by mouth 2 (  two) times daily. (Patient not taking: Reported on 09/11/2017) 60 tablet 1  . ondansetron (ZOFRAN ODT) 4 MG disintegrating tablet Take 1 tablet (4 mg total) by mouth every 6 (six) hours as needed for nausea or vomiting. (Patient not taking: Reported on 09/11/2017) 15 tablet 0   No current facility-administered medications on file prior to visit.    There are no Patient Instructions on file for this visit. No Follow-up on file.  Lessly Stigler A Makhia Vosler, PA-C

## 2017-12-11 ENCOUNTER — Ambulatory Visit (INDEPENDENT_AMBULATORY_CARE_PROVIDER_SITE_OTHER): Payer: 59

## 2017-12-11 ENCOUNTER — Ambulatory Visit (INDEPENDENT_AMBULATORY_CARE_PROVIDER_SITE_OTHER): Payer: 59 | Admitting: Vascular Surgery

## 2017-12-11 ENCOUNTER — Encounter (INDEPENDENT_AMBULATORY_CARE_PROVIDER_SITE_OTHER): Payer: Self-pay | Admitting: Vascular Surgery

## 2017-12-11 DIAGNOSIS — K219 Gastro-esophageal reflux disease without esophagitis: Secondary | ICD-10-CM

## 2017-12-11 DIAGNOSIS — I83813 Varicose veins of bilateral lower extremities with pain: Secondary | ICD-10-CM

## 2017-12-11 DIAGNOSIS — I872 Venous insufficiency (chronic) (peripheral): Secondary | ICD-10-CM | POA: Diagnosis not present

## 2017-12-11 DIAGNOSIS — I890 Lymphedema, not elsewhere classified: Secondary | ICD-10-CM

## 2017-12-11 DIAGNOSIS — I10 Essential (primary) hypertension: Secondary | ICD-10-CM

## 2017-12-16 ENCOUNTER — Encounter (INDEPENDENT_AMBULATORY_CARE_PROVIDER_SITE_OTHER): Payer: Self-pay | Admitting: Vascular Surgery

## 2017-12-16 DIAGNOSIS — I10 Essential (primary) hypertension: Secondary | ICD-10-CM | POA: Insufficient documentation

## 2017-12-16 DIAGNOSIS — I890 Lymphedema, not elsewhere classified: Secondary | ICD-10-CM | POA: Insufficient documentation

## 2017-12-16 DIAGNOSIS — I872 Venous insufficiency (chronic) (peripheral): Secondary | ICD-10-CM | POA: Insufficient documentation

## 2017-12-16 DIAGNOSIS — K219 Gastro-esophageal reflux disease without esophagitis: Secondary | ICD-10-CM | POA: Insufficient documentation

## 2017-12-16 DIAGNOSIS — I83813 Varicose veins of bilateral lower extremities with pain: Secondary | ICD-10-CM | POA: Insufficient documentation

## 2017-12-16 NOTE — Progress Notes (Signed)
MRN : [id number]  Jesus Travis is a 42 y.o. (1976/03/19) male who presents with chief complaint of  Chief Complaint  Patient presents with  . Follow-up    3 month reflux  .  History of Present Illness: The patient returns for followup evaluation 3 months after the initial visit. The patient continues to have pain in the lower extremities with dependency. The pain is lessened with elevation. Graduated compression stockings, Class I (20-30 mmHg), have been worn but the stockings do not eliminate the leg pain. Over-the-counter analgesics do not improve the symptoms. The degree of discomfort continues to interfere with daily activities. The patient notes the pain in the legs is causing problems with daily exercise, at the workplace and even with household activities and maintenance such as standing in the kitchen preparing meals and doing dishes.   Venous ultrasound shows normal deep venous system, no evidence of acute or chronic DVT.  Superficial reflux is present in the GSV and SSV bilaterally.   Current Meds  Medication Sig  . famotidine (PEPCID) 20 MG tablet Take 1 tablet (20 mg total) by mouth 2 (two) times daily.  Marland Kitchen lisinopril-hydrochlorothiazide (PRINZIDE,ZESTORETIC) 20-25 MG tablet Take 1 tablet by mouth daily.    Past Medical History:  Diagnosis Date  . CVA (cerebral infarction) 2011   mild   . Hypertension     Past Surgical History:  Procedure Laterality Date  . WISDOM TOOTH EXTRACTION      Social History Social History   Tobacco Use  . Smoking status: Never Smoker  . Smokeless tobacco: Never Used  Substance Use Topics  . Alcohol use: No  . Drug use: No    Family History Family History  Problem Relation Age of Onset  . Hypertension Mother   . Stroke Mother   . Hypertension Father   . Stroke Maternal Grandmother     No Known Allergies   REVIEW OF SYSTEMS (Negative unless checked)  Constitutional: [] Weight loss  [] Fever  [] Chills Cardiac: [] Chest  pain   [] Chest pressure   [] Palpitations   [] Shortness of breath when laying flat   [] Shortness of breath with exertion. Vascular:  [] Pain in legs with walking   [x] Pain in legs with standing [] History of DVT   [] Phlebitis   [x] Swelling in legs   [x] Varicose veins   [] Non-healing ulcers Pulmonary:   [] Uses home oxygen   [] Productive cough   [] Hemoptysis   [] Wheeze  [] COPD   [] Asthma Neurologic:  [] Dizziness   [] Seizures   [] History of stroke   [] History of TIA  [] Aphasia   [] Vissual changes   [] Weakness or numbness in arm   [] Weakness or numbness in leg Musculoskeletal:   [] Joint swelling   [x] Joint pain   [] Low back pain Hematologic:  [] Easy bruising  [] Easy bleeding   [] Hypercoagulable state   [] Anemic Gastrointestinal:  [] Diarrhea   [] Vomiting  [] Gastroesophageal reflux/heartburn   [] Difficulty swallowing. Genitourinary:  [] Chronic kidney disease   [] Difficult urination  [] Frequent urination   [] Blood in urine Skin:  [] Rashes   [] Ulcers  Psychological:  [] History of anxiety   []  History of major depression.  Physical Examination  Vitals:   12/11/17 1605  BP: 118/74  Pulse: 98  Resp: 12  Weight: 246 lb (111.6 kg)  Height: 6' (1.829 m)   Body mass index is 33.36 kg/m. Gen: WD/WN, NAD Head: [place]/AT, No temporalis wasting.  Ear/Nose/Throat: Hearing grossly intact, nares w/o erythema or drainage Eyes: PER, EOMI, sclera nonicteric.  Neck: Supple,  no large masses.   Pulmonary:  Good air movement, no audible wheezing bilaterally, no use of accessory muscles.  Cardiac: RRR, no JVD Vascular: Large varicosities present extensively greater than 10 mm bilaterally.  Moderate venous stasis changes to the legs bilaterally.  2-3+ soft pitting edema Vessel Right Left  Radial Palpable Palpable  PT Palpable Palpable  DP Palpable Palpable  Gastrointestinal: Non-distended. No guarding/no peritoneal signs.  Musculoskeletal: M/S 5/5 throughout.  No deformity or atrophy.  Neurologic: CN 2-12 intact.  Symmetrical.  Speech is fluent. Motor exam as listed above. Psychiatric: Judgment intact, Mood & affect appropriate for pt's clinical situation. Dermatologic: Venous rashes no ulcers noted.  No changes consistent with cellulitis. Lymph : No lichenification or skin changes of chronic lymphedema.  CBC Lab Results  Component Value Date   WBC 5.1 06/22/2017   HGB 12.7 (L) 06/22/2017   HCT 39.9 (L) 06/22/2017   MCV 70.2 (L) 06/22/2017   PLT 304 06/22/2017    BMET    Component Value Date/Time   NA 140 06/22/2017 1731   K 3.8 06/22/2017 1731   CL 101 06/22/2017 1731   CO2 29 06/22/2017 1731   GLUCOSE 89 06/22/2017 1731   BUN 24 (H) 06/22/2017 1731   CREATININE 0.94 06/22/2017 1731   CALCIUM 9.5 06/22/2017 1731   GFRNONAA >60 06/22/2017 1731   GFRAA >60 06/22/2017 1731   CrCl cannot be calculated (Patient's most recent lab result is older than the maximum 21 days allowed.).  COAG No results found for: INR, PROTIME  Radiology No results found.    Assessment/Plan 1. Varicose veins of both lower extremities with pain Recommend  I have reviewed my previous  discussion with the patient regarding  varicose veins and why they cause symptoms. Patient will continue  wearing graduated compression stockings class 1 on a daily basis, beginning first thing in the morning and removing them in the evening.    In addition, behavioral modification including elevation during the day was again discussed and this will continue.  The patient has utilized over the counter pain medications and has been exercising.  However, at this time conservative therapy has not alleviated the patient's symptoms of leg pain and swelling  Recommend: laser ablation of the right and  left great saphenous veins to eliminate the symptoms of pain and swelling of the lower extremities caused by the severe superficial venous reflux disease.   The patient wishes to move forward with the left leg at this time.   Therefore the laser of the great and small saphenous vein of the left lower extremity will be performed and we will see if the right leg is to be treated after completion of the left leg.  This is by the patient's preference.  2. Chronic venous insufficiency No surgery or intervention at this point in time.    I have had a long discussion with the patient regarding venous insufficiency and why it  causes symptoms. I have discussed with the patient the chronic skin changes that accompany venous insufficiency and the long term sequela such as infection and ulceration.  Patient will begin wearing graduated compression stockings class 1 (20-30 mmHg) or compression wraps on a daily basis a prescription was given. The patient will put the stockings on first thing in the morning and removing them in the evening. The patient is instructed specifically not to sleep in the stockings.    In addition, behavioral modification including several periods of elevation of the lower extremities during  the day will be continued. I have demonstrated that proper elevation is a position with the ankles at heart level.  The patient is instructed to begin routine exercise, especially walking on a daily basis  Duplex ultrasound as noted above there is reflux in the superficial system bilaterally.  We will treat the superficial reflux on the left per patient's preference.  Given the patient's chronic venous changes in association with his edema and lymphatic changes.  Once his varicosities have been treated we will reassess for the long-term need for a lymph pump.  3. Lymphedema Continue compression for now.  As noted in #2 we will reassess in the future whether a lymphatic pump will be of good benefit.  4. Essential hypertension Continue antihypertensive medications as already ordered, these medications have been reviewed and there are no changes at this time.   5. Gastroesophageal reflux disease without  esophagitis Continue antihypertensive medications as already ordered, these medications have been reviewed and there are no changes at this time.  Avoidence of caffeine and alcohol  Moderate elevation of the head of the bed     Levora Dredge, MD  12/16/2017 1:34 PM

## 2018-05-24 ENCOUNTER — Emergency Department
Admission: EM | Admit: 2018-05-24 | Discharge: 2018-05-24 | Disposition: A | Discharge status: Home / Self Care | Payer: Managed Care, Other (non HMO) | Attending: Emergency Medicine | Admitting: Emergency Medicine

## 2018-05-24 ENCOUNTER — Encounter: Payer: Self-pay | Admitting: Emergency Medicine

## 2018-05-24 ENCOUNTER — Other Ambulatory Visit: Payer: Self-pay

## 2018-05-24 DIAGNOSIS — Z202 Contact with and (suspected) exposure to infections with a predominantly sexual mode of transmission: Secondary | ICD-10-CM

## 2018-05-24 DIAGNOSIS — A599 Trichomoniasis, unspecified: Secondary | ICD-10-CM | POA: Insufficient documentation

## 2018-05-24 DIAGNOSIS — I10 Essential (primary) hypertension: Secondary | ICD-10-CM | POA: Diagnosis not present

## 2018-05-24 DIAGNOSIS — R300 Dysuria: Secondary | ICD-10-CM | POA: Diagnosis present

## 2018-05-24 MED ORDER — METRONIDAZOLE 500 MG PO TABS: 500.0000 mg | ORAL_TABLET | Freq: Two times a day (BID) | ORAL | 0 refills | Status: DC

## 2018-05-24 NOTE — ED Notes (Signed)
Per dr. Mayford Knife this pt is only being given rx for trich from previous dx. Had come in with with wife and requested treatment.  Pt not triaged and vitals signs not needed per dr.

## 2018-05-24 NOTE — ED Provider Notes (Signed)
  [facility] Emergency Department Provider Note       Time seen: ----------------------------------------- 10:24 PM on 05/24/2018 -----------------------------------------   I have reviewed the triage vital signs and the nursing notes.  HISTORY   Chief Complaint No chief complaint on file.    HPI Jesus Travis is a 42 y.o. male with a history of CVA, hypertension who presents to the ED for treatment for trichomonas.  Patient's wife was treated for trichomonas during this ER visit.  Patient is requesting treatment concerning same.  Patient denies any dysuria.  Past Medical History:  Diagnosis Date  . CVA (cerebral infarction) 2011   mild   . Hypertension     Patient Active Problem List   Diagnosis Date Noted  . Varicose veins of both lower extremities with pain 12/16/2017  . Chronic venous insufficiency 12/16/2017  . Lymphedema 12/16/2017  . Essential hypertension 12/16/2017  . GERD (gastroesophageal reflux disease) 12/16/2017    Past Surgical History:  Procedure Laterality Date  . WISDOM TOOTH EXTRACTION      Allergies Patient has no known allergies.  Social History Social History   Tobacco Use  . Smoking status: Never Smoker  . Smokeless tobacco: Never Used  Substance Use Topics  . Alcohol use: No  . Drug use: No   Review of Systems Constitutional: Negative for fever. Gastrointestinal: Negative for abdominal pain Genitourinary: Negative for dysuria. Skin: Negative for rash. Neurological: Negative for headaches, focal weakness or numbness.  All systems negative/normal/unremarkable except as stated in the HPI  ____________________________________________   PHYSICAL EXAM:  VITAL SIGNS: ED Triage Vitals  Enc Vitals Group     BP      Pulse      Resp      Temp      Temp src      SpO2      Weight      Height      Head Circumference      Peak Flow      Pain Score      Pain Loc      Pain Edu?      Excl. in GC?     Constitutional: Alert and oriented. Well appearing and in no distress. Gastrointestinal: Soft and nontender. Normal bowel sounds Musculoskeletal: Nontender with normal range of motion in extremities. No lower extremity tenderness nor edema. Neurologic:  Normal speech and language. No gross focal neurologic deficits are appreciated.  Skin:  Skin is warm, dry and intact. No rash noted. ____________________________________________  ED COURSE:  As part of my medical decision making, I reviewed the following data within the electronic MEDICAL RECORD NUMBER History obtained from family if available, nursing notes, old chart and ekg, as well as notes from prior ED visits. Patient presented for treatment concerning his wife's trichomonas, patient will be given a one-time dose of Flagyl.   Procedures ____________________________________________  DIFFERENTIAL DIAGNOSIS   Trichomoniasis, STD  FINAL ASSESSMENT AND PLAN  Trichomonas   Plan: The patient had presented for treatment regarding trichomonas.  He was given Flagyl, he is cleared for outpatient follow-up.   Ulice Dash, MD   Note: This note was generated in part or whole with voice recognition software. Voice recognition is usually quite accurate but there are transcription errors that can and very often do occur. I apologize for any typographical errors that were not detected and corrected.     Emily Filbert, MD 05/24/18 2226

## 2018-07-09 DIAGNOSIS — E559 Vitamin D deficiency, unspecified: Secondary | ICD-10-CM | POA: Insufficient documentation

## 2018-07-12 DIAGNOSIS — B019 Varicella without complication: Secondary | ICD-10-CM | POA: Insufficient documentation

## 2018-07-13 ENCOUNTER — Encounter: Payer: Self-pay | Admitting: Podiatry

## 2018-07-13 ENCOUNTER — Ambulatory Visit: Payer: Managed Care, Other (non HMO) | Admitting: Podiatry

## 2018-07-13 DIAGNOSIS — Q828 Other specified congenital malformations of skin: Secondary | ICD-10-CM | POA: Diagnosis not present

## 2018-07-13 NOTE — Patient Instructions (Signed)
Dr. Loyce Dys and Callus remover The active ingredient you need is Salicylic Acid.

## 2018-07-13 NOTE — Progress Notes (Signed)
   Subjective: 42 year old male presenting today as a new patient with a chief complaint of throbbing pain to the plantar aspect of the left foot that began three months ago. He states he stepped on a piece of glass when the pain began and it feels like there is still something in the foot. Stepping on the foot in certain ways increases the pain. He has not done anything for treatment. Patient is here for further evaluation and treatment.   Past Medical History:  Diagnosis Date  . CVA (cerebral infarction) 2011   mild   . Hypertension      Objective:  Physical Exam General: Alert and oriented x3 in no acute distress  Dermatology: Hyperkeratotic lesion present on the left foot. Pain on palpation with a central nucleated core noted. Skin is warm, dry and supple bilateral lower extremities. Negative for open lesions or macerations.  Vascular: Palpable pedal pulses bilaterally. No edema or erythema noted. Capillary refill within normal limits.  Neurological: Epicritic and protective threshold grossly intact bilaterally.   Musculoskeletal Exam: Pain on palpation at the keratotic lesion noted. Range of motion within normal limits bilateral. Muscle strength 5/5 in all groups bilateral.  Assessment: 1. Porokeratosis left foot    Plan of Care:  1. Patient evaluated 2. Excisional debridement of keratoic lesion using a chisel blade was performed without incident. Salinocaine applied to area. 3. Dressed area with light dressing. 4. Patient is to return to the clinic in 5 weeks.   Felecia Shelling, DPM [facility]  Dr. Felecia Shelling, DPM    [address]                                        [place], [place] [address]                Office [phone]  Fax [phone]

## 2018-08-17 ENCOUNTER — Ambulatory Visit: Payer: Managed Care, Other (non HMO) | Admitting: Podiatry

## 2018-10-12 ENCOUNTER — Telehealth (INDEPENDENT_AMBULATORY_CARE_PROVIDER_SITE_OTHER): Payer: Self-pay

## 2018-10-12 NOTE — Telephone Encounter (Signed)
Called Jesus Travis and informed him of Fallon's message. Jesus Travis understood but he did state that he has new insurance with [place]. [name] did you want me to come to office and drop off copy?

## 2018-10-12 NOTE — Telephone Encounter (Signed)
Pt called and left a vm stating that he needed someone to call him back in regards to his leg. Called pt back and he stated that the last time he was were here the provider wanted him to have a procedure but at that time he was in between  jobs and was unable to schedule. He states he is currently working and is ready to schedule. He is unsure of the procedure. Looks like he saw Dr. Lorretta Harp last year.   Please advise

## 2018-10-12 NOTE — Telephone Encounter (Signed)
He saw Dr. Gilda Crease about an endovenous ablation of the veins in his left leg.  If he would like to proceed we would need to get approval from his new insurance and then go from there.  Please call him back to let him know that we will apply to his insurance, and once we get word about approval or denial we will call him to let him know.

## 2019-06-11 ENCOUNTER — Other Ambulatory Visit: Payer: Self-pay

## 2019-06-11 DIAGNOSIS — Z20822 Contact with and (suspected) exposure to covid-19: Secondary | ICD-10-CM

## 2019-06-13 LAB — NOVEL CORONAVIRUS, NAA: SARS-CoV-2, NAA: NOT DETECTED

## 2019-12-03 ENCOUNTER — Telehealth (INDEPENDENT_AMBULATORY_CARE_PROVIDER_SITE_OTHER): Payer: Self-pay

## 2019-12-03 NOTE — Telephone Encounter (Signed)
Patient has been made aware with medical advice and informed that he will call back to schedule his appointment

## 2019-12-03 NOTE — Telephone Encounter (Signed)
The covid shot likely has nothing to do with his left leg pain.  Looking at this last office note in 2019 we were  discussing a laser of his GSV but it isn't entirely clear from the notes why we didn't proceed.  The pain could be from worsening of his varicose veins, especially if he has not been wearing them consistently.  If he just started wearing the compression stockings when the swelling started it may take some time for the swelling subside.  He should continue to use ibuprofen and elevate his leg when possible.  Since it has been two years since we've seen him in the office, if he wants to move forward with the laser, we would have to repeat his bilateral venous reflux studies as well as have an office.  He can be scheduled with me or [facility] if that is what he would like to do.

## 2019-12-31 ENCOUNTER — Other Ambulatory Visit (INDEPENDENT_AMBULATORY_CARE_PROVIDER_SITE_OTHER): Payer: Self-pay | Admitting: Nurse Practitioner

## 2019-12-31 DIAGNOSIS — M79662 Pain in left lower leg: Secondary | ICD-10-CM

## 2019-12-31 DIAGNOSIS — M7989 Other specified soft tissue disorders: Secondary | ICD-10-CM

## 2020-01-08 ENCOUNTER — Encounter (INDEPENDENT_AMBULATORY_CARE_PROVIDER_SITE_OTHER): Payer: Self-pay | Admitting: Nurse Practitioner

## 2020-01-08 ENCOUNTER — Ambulatory Visit (INDEPENDENT_AMBULATORY_CARE_PROVIDER_SITE_OTHER): Payer: Managed Care, Other (non HMO) | Admitting: Nurse Practitioner

## 2020-01-08 ENCOUNTER — Other Ambulatory Visit: Payer: Self-pay

## 2020-01-08 ENCOUNTER — Ambulatory Visit (INDEPENDENT_AMBULATORY_CARE_PROVIDER_SITE_OTHER): Payer: Managed Care, Other (non HMO)

## 2020-01-08 VITALS — BP 119/78 | HR 77 | Resp 16 | Ht 72.0 in | Wt 256.0 lb

## 2020-01-08 DIAGNOSIS — K219 Gastro-esophageal reflux disease without esophagitis: Secondary | ICD-10-CM | POA: Diagnosis not present

## 2020-01-08 DIAGNOSIS — M7989 Other specified soft tissue disorders: Secondary | ICD-10-CM

## 2020-01-08 DIAGNOSIS — I10 Essential (primary) hypertension: Secondary | ICD-10-CM | POA: Diagnosis not present

## 2020-01-08 DIAGNOSIS — I83813 Varicose veins of bilateral lower extremities with pain: Secondary | ICD-10-CM

## 2020-01-08 DIAGNOSIS — M79662 Pain in left lower leg: Secondary | ICD-10-CM | POA: Diagnosis not present

## 2020-01-09 ENCOUNTER — Encounter (INDEPENDENT_AMBULATORY_CARE_PROVIDER_SITE_OTHER): Payer: Self-pay | Admitting: Nurse Practitioner

## 2020-01-09 NOTE — Progress Notes (Signed)
Subjective:    Patient ID: Laverna Peace, male    DOB: 10-Jun-1976, 44 y.o.   MRN: [id number] Chief Complaint  Patient presents with  . Follow-up    ultrasound follow up    The patient presents today for follow-up regarding left lower extremity leg pain.  The patient was seen several years ago in our office and was found to have reflux in the bilateral great saphenous veins as well as small saphenous veins.  However at that time he decided not to move forward with any intervention.  The patient is mostly concerned due to the fact that he began having left leg pain after a recent Covid vaccination.  He was concerned due to the information from the news about blood clots in the Covid vaccination.  He denies any severe swelling redness or pain.  He denies any fever, chills, nausea, vomiting or diarrhea.  He denies any chest pain shortness of breath.  Since the patient was last seen in our office about 2 years ago, he has continued to utilize conservative therapy for his varicose veins including wearing medical grade 1 compression stockings, elevation in addition to exercise.  Today the patient underwent noninvasive studies which showed no evidence of DVT in the left lower extremity.  No evidence of superficial venous thrombosis in the left lower extremity no evidence of deep venous insufficiency.  There is no evidence of reflux in the left great saphenous vein however there is reflux seen in the left small saphenous vein with a vein diameter of 0.61 and a reflux time 1731.   Review of Systems  Cardiovascular: Positive for leg swelling.       Large varicosities bilaterally  All other systems reviewed and are negative.      Objective:   Physical Exam Vitals reviewed.  Constitutional:      Appearance: Normal appearance.  Cardiovascular:     Rate and Rhythm: Normal rate and regular rhythm.     Pulses: Normal pulses.     Heart sounds: Normal heart sounds.     Comments: Palpable  varicosities bilaterally measuring about 4 to 5 mm in size Pulmonary:     Effort: Pulmonary effort is normal.     Breath sounds: Normal breath sounds.  Neurological:     Mental Status: He is alert and oriented to person, place, and time.  Psychiatric:        Mood and Affect: Mood normal.        Behavior: Behavior normal.        Thought Content: Thought content normal.        Judgment: Judgment normal.     BP 119/78 (BP Location: Right Arm)   Pulse 77   Resp 16   Ht 6' (1.829 m)   Wt 256 lb (116.1 kg)   BMI 34.72 kg/m   Past Medical History:  Diagnosis Date  . CVA (cerebral infarction) 2011   mild   . Hypertension     Social History   Socioeconomic History  . Marital status: Married    Spouse name: Not on file  . Number of children: Not on file  . Years of education: Not on file  . Highest education level: Not on file  Occupational History  . Not on file  Tobacco Use  . Smoking status: Never Smoker  . Smokeless tobacco: Never Used  Substance and Sexual Activity  . Alcohol use: No  . Drug use: No  . Sexual activity: Not on file  Other Topics Concern  . Not on file  Social History Narrative  . Not on file   Social Determinants of Health   Financial Resource Strain:   . Difficulty of Paying Living Expenses:   Food Insecurity:   . Worried About Programme researcher, broadcasting/film/video in the Last Year:   . Barista in the Last Year:   Transportation Needs:   . Freight forwarder (Medical):   Marland Kitchen Lack of Transportation (Non-Medical):   Physical Activity:   . Days of Exercise per Week:   . Minutes of Exercise per Session:   Stress:   . Feeling of Stress :   Social Connections:   . Frequency of Communication with Friends and Family:   . Frequency of Social Gatherings with Friends and Family:   . Attends Religious Services:   . Active Member of Clubs or Organizations:   . Attends Banker Meetings:   Marland Kitchen Marital Status:   Intimate Partner Violence:   .  Fear of Current or Ex-Partner:   . Emotionally Abused:   Marland Kitchen Physically Abused:   . Sexually Abused:     Past Surgical History:  Procedure Laterality Date  . WISDOM TOOTH EXTRACTION      Family History  Problem Relation Age of Onset  . Hypertension Mother   . Stroke Mother   . Hypertension Father   . Stroke Maternal Grandmother     No Known Allergies     Assessment & Plan:   1. Varicose veins of both lower extremities with pain I had a long discussion with the patient regarding his varicose veins in addition to treatment options.  Previous studies have shown the patient does indeed have some venous reflux in his great saphenous vein however the studies were done over 2 years ago.  The patient would like to move forward however he would like to check with his insurance to see what the cost may be.  The patient will contact our office to schedule a follow-up appointment with a right lower extremity venous reflux study prior to [place] moving forward with any intervention.  Otherwise, the patient is advised to continue with conservative therapy such as elevation, exercise and utilization of his medical grade 1 compression stockings on a daily basis.  Also discussed the patient the associated risk of superficial venous thrombosis with having varicose veins.  Patient was concerned that his recent Covid shot could give him a blood clot.  I discussed the difference between the Pfizer, M[employer] and [employer] vaccine as well as the statistics regarding where the is blood clots were found as well as the rarity of them.  2. Essential hypertension Continue antihypertensive medications as already ordered, these medications have been reviewed and there are no changes at this time.   3. Gastroesophageal reflux disease without esophagitis Continue PPI as already ordered, this medication has been reviewed and there are no changes at this time.  Avoidence of caffeine and alcohol  Moderate  elevation of the head of the bed    Current Outpatient Medications on File Prior to Visit  Medication Sig Dispense Refill  . carvedilol (COREG) 6.25 MG tablet Take 6.25 mg by mouth 2 (two) times daily.    . ferrous sulfate 325 (65 FE) MG tablet Take 325 mg by mouth daily with breakfast.    . lisinopril-hydrochlorothiazide (PRINZIDE,ZESTORETIC) 20-25 MG tablet Take 1 tablet by mouth daily.    . pantoprazole (PROTONIX) 40 MG tablet TAKE 1 TABLET  BY MOUTH ONCE DAILY (NEEDS FOLLOW UP FOR REFILLS OR HAVE PCP REFILL)  1  . Vitamin D, Ergocalciferol, (DRISDOL) 1.25 MG (50000 UT) CAPS capsule Take by mouth.    Marland Kitchen buPROPion (WELLBUTRIN XL) 150 MG 24 hr tablet Take by mouth.    . famotidine (PEPCID) 20 MG tablet Take 1 tablet (20 mg total) by mouth 2 (two) times daily. (Patient not taking: Reported on 01/08/2020) 60 tablet 1  . metroNIDAZOLE (FLAGYL) 500 MG tablet Take 1 tablet (500 mg total) by mouth 2 (two) times daily. (Patient not taking: Reported on 01/08/2020) 14 tablet 0  . sertraline (ZOLOFT) 25 MG tablet Take 25 mg by mouth daily.     No current facility-administered medications on file prior to visit.    There are no Patient Instructions on file for this visit. No follow-ups on file.   Georgiana Spinner, NP

## 2020-01-16 ENCOUNTER — Other Ambulatory Visit (INDEPENDENT_AMBULATORY_CARE_PROVIDER_SITE_OTHER): Payer: Self-pay | Admitting: Nurse Practitioner

## 2020-01-16 DIAGNOSIS — I83813 Varicose veins of bilateral lower extremities with pain: Secondary | ICD-10-CM

## 2020-01-20 ENCOUNTER — Ambulatory Visit (INDEPENDENT_AMBULATORY_CARE_PROVIDER_SITE_OTHER): Payer: Managed Care, Other (non HMO) | Admitting: Nurse Practitioner

## 2020-01-20 ENCOUNTER — Other Ambulatory Visit: Payer: Self-pay

## 2020-01-20 ENCOUNTER — Encounter (INDEPENDENT_AMBULATORY_CARE_PROVIDER_SITE_OTHER): Payer: Self-pay | Admitting: Nurse Practitioner

## 2020-01-20 ENCOUNTER — Ambulatory Visit (INDEPENDENT_AMBULATORY_CARE_PROVIDER_SITE_OTHER): Payer: Managed Care, Other (non HMO)

## 2020-01-20 VITALS — BP 124/80 | HR 83 | Ht 72.0 in | Wt 257.0 lb

## 2020-01-20 DIAGNOSIS — K219 Gastro-esophageal reflux disease without esophagitis: Secondary | ICD-10-CM

## 2020-01-20 DIAGNOSIS — I10 Essential (primary) hypertension: Secondary | ICD-10-CM | POA: Diagnosis not present

## 2020-01-20 DIAGNOSIS — I83813 Varicose veins of bilateral lower extremities with pain: Secondary | ICD-10-CM

## 2020-01-20 NOTE — Progress Notes (Signed)
Subjective:    Patient ID: Jesus Travis, male    DOB: 1975-12-21, 44 y.o.   MRN: [id number] Chief Complaint  Patient presents with  . Follow-up    U/S follow up    The patient returns for followup evaluation 3 months after the initial visit. The patient continues to have pain in the lower extremities with dependency. The pain is lessened with elevation. Graduated compression stockings, Class I (20-30 mmHg), have been worn but the stockings do not eliminate the leg pain. Over-the-counter analgesics do not improve the symptoms. The degree of discomfort continues to interfere with daily activities. The patient notes the pain in the legs is causing problems with daily exercise, at the workplace and even with household activities and maintenance such as standing in the kitchen preparing meals and doing dishes.   Venous ultrasound shows normal deep venous system, no evidence of acute or chronic DVT.  Superficial reflux is present in the great saphenous vein possible saphenofemoral junction.  The patient also has reflux in the right common femoral vein and popliteal vein.  Previous ultrasound about a week or so ago also reveals no evidence of DVT in the left lower extremity.  No evidence of superficial venous thrombosis in the left lower extremity no evidence of deep venous insufficiency.  There is no evidence of reflux in the left great saphenous vein however there is reflux seen in the left small saphenous vein with a vein diameter of 0.61 and a reflux time 1731.   Review of Systems  Cardiovascular: Positive for leg swelling.  Musculoskeletal: Positive for myalgias.  All other systems reviewed and are negative.      Objective:   Physical Exam Vitals reviewed.  Constitutional:      Appearance: Normal appearance.  HENT:     Head: Normocephalic.  Cardiovascular:     Rate and Rhythm: Normal rate and regular rhythm.     Comments: Large varicosities on the left lower extremity  approximately 5 to 6 mm in size. Pulmonary:     Effort: Pulmonary effort is normal.     Breath sounds: Normal breath sounds.  Musculoskeletal:        General: Normal range of motion.  Neurological:     Mental Status: He is alert and oriented to person, place, and time.  Psychiatric:        Mood and Affect: Mood normal.        Behavior: Behavior normal.        Thought Content: Thought content normal.        Judgment: Judgment normal.     BP 124/80   Pulse 83   Ht 6' (1.829 m)   Wt 257 lb (116.6 kg)   BMI 34.86 kg/m   Past Medical History:  Diagnosis Date  . CVA (cerebral infarction) 2011   mild   . Hypertension     Social History   Socioeconomic History  . Marital status: Married    Spouse name: Not on file  . Number of children: Not on file  . Years of education: Not on file  . Highest education level: Not on file  Occupational History  . Not on file  Tobacco Use  . Smoking status: Never Smoker  . Smokeless tobacco: Never Used  Substance and Sexual Activity  . Alcohol use: No  . Drug use: No  . Sexual activity: Not on file  Other Topics Concern  . Not on file  Social History Narrative  . Not  on file   Social Determinants of Health   Financial Resource Strain:   . Difficulty of Paying Living Expenses:   Food Insecurity:   . Worried About Charity fundraiser in the Last Year:   . Arboriculturist in the Last Year:   Transportation Needs:   . Film/video editor (Medical):   Marland Kitchen Lack of Transportation (Non-Medical):   Physical Activity:   . Days of Exercise per Week:   . Minutes of Exercise per Session:   Stress:   . Feeling of Stress :   Social Connections:   . Frequency of Communication with Friends and Family:   . Frequency of Social Gatherings with Friends and Family:   . Attends Religious Services:   . Active Member of Clubs or Organizations:   . Attends Archivist Meetings:   Marland Kitchen Marital Status:   Intimate Partner Violence:   .  Fear of Current or Ex-Partner:   . Emotionally Abused:   Marland Kitchen Physically Abused:   . Sexually Abused:     Past Surgical History:  Procedure Laterality Date  . WISDOM TOOTH EXTRACTION      Family History  Problem Relation Age of Onset  . Hypertension Mother   . Stroke Mother   . Hypertension Father   . Stroke Maternal Grandmother     No Known Allergies     Assessment & Plan:   1. Varicose veins of both lower extremities with pain Recommend  I have reviewed my previous  discussion with the patient regarding  varicose veins and why they cause symptoms. Patient will continue  wearing graduated compression stockings class 1 on a daily basis, beginning first thing in the morning and removing them in the evening.    In addition, behavioral modification including elevation during the day was again discussed and this will continue.  The patient has utilized over the counter pain medications and has been exercising.  However, at this time conservative therapy has not alleviated the patient's symptoms of leg pain and swelling  Recommend: laser ablation of the right and  left great saphenous veins to eliminate the symptoms of pain and swelling of the lower extremities caused by the severe superficial venous reflux disease.    2. Essential hypertension Continue antihypertensive medications as already ordered, these medications have been reviewed and there are no changes at this time.   3. Gastroesophageal reflux disease without esophagitis Continue PPI as already ordered, this medication has been reviewed and there are no changes at this time.  Avoidence of caffeine and alcohol  Moderate elevation of the head of the bed    Current Outpatient Medications on File Prior to Visit  Medication Sig Dispense Refill  . carvedilol (COREG) 6.25 MG tablet Take 6.25 mg by mouth 2 (two) times daily.    . famotidine (PEPCID) 20 MG tablet Take 1 tablet (20 mg total) by mouth 2 (two) times daily.  60 tablet 1  . ferrous sulfate 325 (65 FE) MG tablet Take 325 mg by mouth daily with breakfast.    . lisinopril-hydrochlorothiazide (PRINZIDE,ZESTORETIC) 20-25 MG tablet Take 1 tablet by mouth daily.    . metroNIDAZOLE (FLAGYL) 500 MG tablet Take 1 tablet (500 mg total) by mouth 2 (two) times daily. 14 tablet 0  . Vitamin D, Ergocalciferol, (DRISDOL) 1.25 MG (50000 UT) CAPS capsule Take by mouth.    Marland Kitchen buPROPion (WELLBUTRIN XL) 150 MG 24 hr tablet Take by mouth.    . pantoprazole (PROTONIX) 40  MG tablet TAKE 1 TABLET BY MOUTH ONCE DAILY (NEEDS FOLLOW UP FOR REFILLS OR HAVE PCP REFILL)  1  . sertraline (ZOLOFT) 25 MG tablet Take 25 mg by mouth daily.     No current facility-administered medications on file prior to visit.    There are no Patient Instructions on file for this visit. No follow-ups on file.   Georgiana Spinner, NP

## 2020-03-10 ENCOUNTER — Telehealth (INDEPENDENT_AMBULATORY_CARE_PROVIDER_SITE_OTHER): Payer: Self-pay

## 2020-03-10 NOTE — Telephone Encounter (Signed)
Patient called stating he was told he would have to have an EKG before his laser procedure on 03/12/20. I explained that he will not need an EKG for this procedure, the patient will not be put to sleep. Patient then called back to let me know he did not have the prescription for the 2 Xanax tablets. This will be called into his pharmacy at [facility] [address].

## 2020-03-12 ENCOUNTER — Ambulatory Visit (INDEPENDENT_AMBULATORY_CARE_PROVIDER_SITE_OTHER): Payer: Managed Care, Other (non HMO) | Admitting: Vascular Surgery

## 2020-03-12 ENCOUNTER — Encounter (INDEPENDENT_AMBULATORY_CARE_PROVIDER_SITE_OTHER): Payer: Self-pay | Admitting: Vascular Surgery

## 2020-03-12 ENCOUNTER — Other Ambulatory Visit: Payer: Self-pay

## 2020-03-12 VITALS — BP 120/81 | HR 78 | Resp 16 | Wt 256.0 lb

## 2020-03-12 DIAGNOSIS — I83813 Varicose veins of bilateral lower extremities with pain: Secondary | ICD-10-CM

## 2020-03-12 DIAGNOSIS — I83812 Varicose veins of left lower extremities with pain: Secondary | ICD-10-CM

## 2020-03-12 NOTE — Progress Notes (Signed)
    MRN : [id number]  ANIKETH HUBERTY is a 44 y.o. (1976/09/03) male who presents with chief complaint of painful varicose veins.    The patient's left lower extremity was sterilely prepped and draped.  The ultrasound machine was used to visualize the left small saphenous vein throughout its course.  A segment in the mid calf was selected for access.  The saphenous vein was accessed without difficulty using ultrasound guidance with a micropuncture needle.   An 0.018  wire was placed beyond the saphenofemoral junction through the sheath and the microneedle was removed.  The 65 cm sheath was then placed over the wire and the wire and dilator were removed.  The laser fiber was placed through the sheath and its tip was placed approximately 2 cm below the saphenofemoral junction.  Tumescent anesthesia was then created with a dilute lidocaine solution.  Laser energy was then delivered with constant withdrawal of the sheath and laser fiber.  Approximately 974 Joules of energy were delivered over a length of 19 cm.  Sterile dressings were placed.  The patient tolerated the procedure well without complications.

## 2020-03-16 ENCOUNTER — Other Ambulatory Visit: Payer: Self-pay

## 2020-03-16 ENCOUNTER — Ambulatory Visit (INDEPENDENT_AMBULATORY_CARE_PROVIDER_SITE_OTHER): Payer: Managed Care, Other (non HMO)

## 2020-03-16 ENCOUNTER — Other Ambulatory Visit (INDEPENDENT_AMBULATORY_CARE_PROVIDER_SITE_OTHER): Payer: Self-pay | Admitting: Vascular Surgery

## 2020-03-16 DIAGNOSIS — I872 Venous insufficiency (chronic) (peripheral): Secondary | ICD-10-CM

## 2020-03-20 ENCOUNTER — Telehealth (INDEPENDENT_AMBULATORY_CARE_PROVIDER_SITE_OTHER): Payer: Self-pay

## 2020-03-20 NOTE — Telephone Encounter (Signed)
Patient called stating that he was still having pain in the calf area from left ssv laser that was done on 03/12/20. The patient stated that he has been taking ibuprofen for pain but he has no swelling or redness. Patient was advise to alternate tylenol  and ibuprofen also he can use ice to help with pain relief. Patient was made aware with medical advice and verbalized understanding.

## 2020-03-22 ENCOUNTER — Emergency Department: Payer: Managed Care, Other (non HMO)

## 2020-03-22 ENCOUNTER — Emergency Department
Admission: EM | Admit: 2020-03-22 | Discharge: 2020-03-22 | Disposition: A | Discharge status: Home / Self Care | Payer: Managed Care, Other (non HMO) | Attending: Emergency Medicine | Admitting: Emergency Medicine

## 2020-03-22 ENCOUNTER — Other Ambulatory Visit: Payer: Self-pay

## 2020-03-22 ENCOUNTER — Encounter: Payer: Self-pay | Admitting: Emergency Medicine

## 2020-03-22 DIAGNOSIS — I82402 Acute embolism and thrombosis of unspecified deep veins of left lower extremity: Secondary | ICD-10-CM | POA: Insufficient documentation

## 2020-03-22 DIAGNOSIS — Z20822 Contact with and (suspected) exposure to covid-19: Secondary | ICD-10-CM | POA: Insufficient documentation

## 2020-03-22 DIAGNOSIS — I10 Essential (primary) hypertension: Secondary | ICD-10-CM | POA: Insufficient documentation

## 2020-03-22 DIAGNOSIS — R2242 Localized swelling, mass and lump, left lower limb: Secondary | ICD-10-CM | POA: Diagnosis present

## 2020-03-22 DIAGNOSIS — I82432 Acute embolism and thrombosis of left popliteal vein: Secondary | ICD-10-CM

## 2020-03-22 MED ORDER — APIXABAN 5 MG PO TABS: 5.0000 mg | ORAL_TABLET | ORAL | Status: DC

## 2020-03-22 MED ORDER — APIXABAN 5 MG PO TABS
10.0000 mg | ORAL_TABLET | ORAL | Status: AC
  Administered 2020-03-22: 10 mg via ORAL
  Filled 2020-03-22: qty 2

## 2020-03-22 MED ORDER — APIXABAN 5 MG PO TABS: ORAL_TABLET | ORAL | 0 refills | Status: DC

## 2020-03-22 NOTE — ED Triage Notes (Signed)
Pt here for left calf pain and swelling. also having pain behind left knee.  About week and half ago had laser surgery for varicose vein on that leg.  Pain started Thursday this past week.

## 2020-03-22 NOTE — ED Provider Notes (Signed)
[facility] Emergency Department Provider Note  ____________________________________________  Time seen: Approximately 6:01 PM  I have reviewed the triage vital signs and the nursing notes.   HISTORY  Chief Complaint Leg Swelling    HPI LINKIN VIZZINI is a 44 y.o. male with a history of prior stroke, hypertension, recent laser therapy for varicose vein on the left leg who comes ED complaining of left leg pain behind the knee and calf associate with swelling, started 3 days ago, constant and worsening.  No chest pain or shortness of breath, no fever, no trauma.      Past Medical History:  Diagnosis Date  . CVA (cerebral infarction) 2011   mild   . Hypertension      Patient Active Problem List   Diagnosis Date Noted  . Chickenpox 07/12/2018  . Vitamin D deficiency 07/09/2018  . Varicose veins of both lower extremities with pain 12/16/2017  . Chronic venous insufficiency 12/16/2017  . Lymphedema 12/16/2017  . Essential hypertension 12/16/2017  . GERD (gastroesophageal reflux disease) 12/16/2017     Past Surgical History:  Procedure Laterality Date  . WISDOM TOOTH EXTRACTION       Prior to Admission medications   Medication Sig Start Date End Date Taking? Authorizing Provider  apixaban (ELIQUIS) 5 MG TABS tablet Take 2 tablets (10mg ) twice daily for 7 days, then 1 tablet (5mg ) twice daily 03/22/20   , MD  buPROPion (WELLBUTRIN XL) 150 MG 24 hr tablet Take by mouth. 06/11/18 06/11/19  [provider]  carvedilol (COREG) 6.25 MG tablet Take 6.25 mg by mouth 2 (two) times daily. 12/20/19   [provider]  famotidine (PEPCID) 20 MG tablet Take 1 tablet (20 mg total) by mouth 2 (two) times daily. 03/16/16   12/22/19, MD  ferrous sulfate 325 (65 FE) MG tablet Take 325 mg by mouth daily with breakfast.    [provider]  lisinopril-hydrochlorothiazide (PRINZIDE,ZESTORETIC) 20-25 MG tablet Take 1  tablet by mouth daily. 06/19/17   [provider]  metroNIDAZOLE (FLAGYL) 500 MG tablet Take 1 tablet (500 mg total) by mouth 2 (two) times daily. 05/24/18   06/21/17, MD  pantoprazole (PROTONIX) 40 MG tablet TAKE 1 TABLET BY MOUTH ONCE DAILY (NEEDS FOLLOW UP FOR REFILLS OR HAVE PCP REFILL) 04/25/18   [provider]  sertraline (ZOLOFT) 25 MG tablet Take 25 mg by mouth daily. 12/22/16 12/22/17  [provider]  Vitamin D, Ergocalciferol, (DRISDOL) 1.25 MG (50000 UT) CAPS capsule Take by mouth. 06/13/18   [provider]     Allergies Patient has no known allergies.   Family History  Problem Relation Age of Onset  . Hypertension Mother   . Stroke Mother   . Hypertension Father   . Stroke Maternal Grandmother     Social History Social History   Tobacco Use  . Smoking status: Never Smoker  . Smokeless tobacco: Never Used  Substance Use Topics  . Alcohol use: No  . Drug use: No    Review of Systems  Constitutional:   No fever or chills.  Cardiovascular:   No chest pain or syncope. Respiratory:   No dyspnea or cough. Gastrointestinal:   Negative for abdominal pain, vomiting and diarrhea.  Musculoskeletal:   Left leg pain and swelling as above All other systems reviewed and are negative except as documented above in ROS and HPI.  ____________________________________________   PHYSICAL EXAM:  VITAL SIGNS: ED Triage Vitals  Enc  Vitals Group     BP 03/22/20 1510 126/81     Pulse Rate 03/22/20 1510 (!) 102     Resp 03/22/20 1510 18     Temp 03/22/20 1510 98.3 F (36.8 C)     Temp Source 03/22/20 1510 Oral     SpO2 03/22/20 1510 97 %     Weight 03/22/20 1511 256 lb (116.1 kg)     Height 03/22/20 1511 6' (1.829 m)     Head Circumference --      Peak Flow --      Pain Score 03/22/20 1511 7     Pain Loc --      Pain Edu? --      Excl. in GC? --     Vital signs reviewed, nursing assessments reviewed.   Constitutional:    Alert and oriented. Non-toxic appearance. Eyes:   Conjunctivae are normal. EOMI.  ENT      Head:   Normocephalic and atraumatic.      Nose:   Wearing a mask.      Mouth/Throat:   Wearing a mask.      Neck:   No meningismus. Full ROM.  Cardiovascular:   RRR.  Respiratory:   Normal respiratory effort without tachypnea/retractions.  Musculoskeletal:   Normal range of motion in all extremities. No joint effusions.  Tenderness in left leg behind the knee and proximal calf.  No palpable cord.  There is increased calf circumference on the left. Neurologic:   Normal speech and language.  Motor grossly intact. No acute focal neurologic deficits are appreciated.  Skin:    Skin is warm, dry and intact. No rash noted.  No petechiae, purpura, or bullae.  ____________________________________________    LABS (pertinent positives/negatives) (all labs ordered are listed, but only abnormal results are displayed) Labs Reviewed - No data to display ____________________________________________   EKG    ____________________________________________    RADIOLOGY  US Venous Img Lower Unilateral Left  Result Date: 03/22/2020 CLINICAL DATA:  Left calf pain and swelling for 1 week. EXAM: LEFT LOWER EXTREMITY VENOUS DOPPLER ULTRASOUND TECHNIQUE: Gray-scale sonography with compression, as well as color and duplex ultrasound, were performed to evaluate the deep venous system(s) from the level of the common femoral vein through the popliteal and proximal calf veins. COMPARISON:  None. FINDINGS: VENOUS Nonocclusive thrombus is seen in the proximal aspect of the left popliteal vein. There is also occlusive thrombus in the left lesser saphenous vein. Imaged veins are otherwise negative. Limited views of the contralateral common femoral vein are unremarkable. OTHER None. Limitations: none IMPRESSION: The examination is positive for nonocclusive deep venous thrombus in the left popliteal vein and occlusive  superficial venous thrombus in the left lesser saphenous vein. Electronically Signed   By: Drusilla Kanner M.D.   On: 03/22/2020 16:31    ____________________________________________   PROCEDURES Procedures  ____________________________________________    CLINICAL IMPRESSION / ASSESSMENT AND PLAN / ED COURSE  Medications ordered in the ED: Medications  apixaban (ELIQUIS) tablet 10 mg (has no administration in time range)    Pertinent labs & imaging results that were available during my care of the patient were reviewed by me and considered in my medical decision making (see chart for details).  Kali A Wacha was evaluated in Emergency Department on 03/22/2020 for the symptoms described in the history of present illness. He was evaluated in the context of the global COVID-19 pandemic, which necessitated consideration that the patient might be at risk for infection  with the SARS-CoV-2 virus that causes COVID-19. Institutional protocols and algorithms that pertain to the evaluation of patients at risk for COVID-19 are in a state of rapid change based on information released by regulatory bodies including the CDC and federal and state organizations. These policies and algorithms were followed during the patient's care in the ED.   Patient presents with left leg pain and swelling after recent varicose vein treatment.  Ultrasound positive for nonocclusive DVT in the proximal popliteal and junction with the lesser saphenous vein.  No phlegmasia, no evidence of PE, no evidence of infection.  No contraindication anticoagulation.  Will start Eliquis, 30-day trial coupon given.  Recommended he follow-up with Dr. Gilda Crease about how this impacts future varicose vein treatments.      ____________________________________________   FINAL CLINICAL IMPRESSION(S) / ED DIAGNOSES    Final diagnoses:  Acute deep vein thrombosis (DVT) of popliteal vein of left lower extremity [facility])     ED  Discharge Orders         Ordered    apixaban (ELIQUIS) 5 MG TABS tablet  Status:  Discontinued     Reprint     03/22/20 1728    apixaban (ELIQUIS) 5 MG TABS tablet     Discontinue  Reprint     03/22/20 1735          Portions of this note were generated with dragon dictation software. Dictation errors may occur despite best attempts at proofreading.   Sharman Cheek, MD 03/22/20 [phone]

## 2020-03-23 ENCOUNTER — Telehealth (INDEPENDENT_AMBULATORY_CARE_PROVIDER_SITE_OTHER): Payer: Self-pay | Admitting: Vascular Surgery

## 2020-03-23 ENCOUNTER — Other Ambulatory Visit (INDEPENDENT_AMBULATORY_CARE_PROVIDER_SITE_OTHER): Payer: Self-pay

## 2020-03-23 NOTE — Telephone Encounter (Signed)
The patient has been placed on eliquis which is the appropriate therapy for a DVT. One of the thrombus they saw was expected because of the laser.  The patient should continue his eliquis and we will see him on 07/22

## 2020-03-23 NOTE — Telephone Encounter (Signed)
Called stating that he was in the ED yesterday 03-22-20 and hospital found two dvt's in his left leg. He would like to come in to be seen today if possible. Patient was last seen 03-16-20 for a post laser [place]. He has an upcoming appt with [place] on 03-26-20 R laser ablation.  Please advise.

## 2020-03-23 NOTE — Telephone Encounter (Signed)
Patient was made aware with medical advice and verbalized understanding. The patient ask was okay to take Eliquis since he is taking carvedilol and spironolactone.  I spoke with Dr Gilda Crease he advise that the patient can take prescribe medication.

## 2020-03-25 ENCOUNTER — Other Ambulatory Visit (INDEPENDENT_AMBULATORY_CARE_PROVIDER_SITE_OTHER): Payer: Self-pay | Admitting: Vascular Surgery

## 2020-03-25 DIAGNOSIS — I82432 Acute embolism and thrombosis of left popliteal vein: Secondary | ICD-10-CM

## 2020-03-25 DIAGNOSIS — Z9889 Other specified postprocedural states: Secondary | ICD-10-CM

## 2020-03-25 NOTE — Progress Notes (Signed)
MRN : [id number]  Jesus Travis is a 44 y.o. (01/01/1976) male who presents with chief complaint of No chief complaint on file. Marland Kitchen  History of Present Illness:   The patient returns to the office for followup status post laser ablation of the left small saphenous vein on 03/12/2020.   He experience increased pain and went to the ER where duplex ultrasound was done which showed nonocclusive thrombus in the popliteal vein.  The patient notes multiple residual varicosities bilaterally which continued to hurt with dependent positions and remained tender to palpation. The patient's swelling is increased mildly from preoperative status. The patient continues to wear graduated compression stockings on a daily basis but these are not eliminating the pain and discomfort. The patient continues to use over-the-counter anti-inflammatory medications to treat the pain and related symptoms but this has not given the patient relief. The patient notes the pain in the lower extremities is causing problems with daily exercise, problems at work and even with household activities such as preparing meals and doing dishes.   Post laser ultrasound shows successful ablation of the left small saphenous vein    No outpatient medications have been marked as taking for the 03/26/20 encounter (Appointment) with Gilda Crease, Latina Craver, MD.    Past Medical History:  Diagnosis Date   CVA (cerebral infarction) 2011   mild    Hypertension     Past Surgical History:  Procedure Laterality Date   WISDOM TOOTH EXTRACTION      Social History Social History   Tobacco Use   Smoking status: Never Smoker   Smokeless tobacco: Never Used  Substance Use Topics   Alcohol use: No   Drug use: No    Family History Family History  Problem Relation Age of Onset   Hypertension Mother    Stroke Mother    Hypertension Father    Stroke Maternal Grandmother     No Known Allergies   REVIEW OF SYSTEMS (Negative  unless checked)  Constitutional: [] Weight loss  [] Fever  [] Chills Cardiac: [] Chest pain   [] Chest pressure   [] Palpitations   [] Shortness of breath when laying flat   [] Shortness of breath with exertion. Vascular:  [] Pain in legs with walking   [x] Pain in legs at rest  [x] History of DVT   [] Phlebitis   [x] Swelling in legs   [x] Varicose veins   [] Non-healing ulcers Pulmonary:   [] Uses home oxygen   [] Productive cough   [] Hemoptysis   [] Wheeze  [] COPD   [] Asthma Neurologic:  [] Dizziness   [] Seizures   [] History of stroke   [] History of TIA  [] Aphasia   [] Vissual changes   [] Weakness or numbness in arm   [] Weakness or numbness in leg Musculoskeletal:   [] Joint swelling   [] Joint pain   [] Low back pain Hematologic:  [] Easy bruising  [] Easy bleeding   [] Hypercoagulable state   [] Anemic Gastrointestinal:  [] Diarrhea   [] Vomiting  [] Gastroesophageal reflux/heartburn   [] Difficulty swallowing. Genitourinary:  [] Chronic kidney disease   [] Difficult urination  [] Frequent urination   [] Blood in urine Skin:  [] Rashes   [] Ulcers  Psychological:  [] History of anxiety   []  History of major depression.  Physical Examination  There were no vitals filed for this visit. There is no height or weight on file to calculate BMI. Gen: WD/WN, NAD Head: [place]/AT, No temporalis wasting.  Ear/Nose/Throat: Hearing grossly intact, nares w/o erythema or drainage Eyes: PER, EOMI, sclera nonicteric.  Neck: Supple, no large masses.   Pulmonary:  Good  air movement, no audible wheezing bilaterally, no use of accessory muscles.  Cardiac: RRR, no JVD Vascular: Large varicosities left posterior calf present extensively greater than 10 mm with patchy areas of phlebitis.  Mild venous stasis changes to the legs bilaterally.  2+ soft pitting edema Vessel Right Left  Radial Palpable Palpable  PT Palpable Palpable  DP Palpable Palpable  Gastrointestinal: Non-distended. No guarding/no peritoneal signs.  Musculoskeletal: M/S 5/5  throughout.  No deformity or atrophy.  Neurologic: CN 2-12 intact. Symmetrical.  Speech is fluent. Motor exam as listed above. Psychiatric: Judgment intact, Mood & affect appropriate for pt's clinical situation.   CBC Lab Results  Component Value Date   WBC 5.1 06/22/2017   HGB 12.7 (L) 06/22/2017   HCT 39.9 (L) 06/22/2017   MCV 70.2 (L) 06/22/2017   PLT 304 06/22/2017    BMET    Component Value Date/Time   NA 140 06/22/2017 1731   K 3.8 06/22/2017 1731   CL 101 06/22/2017 1731   CO2 29 06/22/2017 1731   GLUCOSE 89 06/22/2017 1731   BUN 24 (H) 06/22/2017 1731   CREATININE 0.94 06/22/2017 1731   CALCIUM 9.5 06/22/2017 1731   GFRNONAA >60 06/22/2017 1731   GFRAA >60 06/22/2017 1731   CrCl cannot be calculated (Patient's most recent lab result is older than the maximum 21 days allowed.).  COAG No results found for: INR, PROTIME  Radiology US Venous Img Lower Unilateral Left  Result Date: 03/22/2020 CLINICAL DATA:  Left calf pain and swelling for 1 week. EXAM: LEFT LOWER EXTREMITY VENOUS DOPPLER ULTRASOUND TECHNIQUE: Gray-scale sonography with compression, as well as color and duplex ultrasound, were performed to evaluate the deep venous system(s) from the level of the common femoral vein through the popliteal and proximal calf veins. COMPARISON:  None. FINDINGS: VENOUS Nonocclusive thrombus is seen in the proximal aspect of the left popliteal vein. There is also occlusive thrombus in the left lesser saphenous vein. Imaged veins are otherwise negative. Limited views of the contralateral common femoral vein are unremarkable. OTHER None. Limitations: none IMPRESSION: The examination is positive for nonocclusive deep venous thrombus in the left popliteal vein and occlusive superficial venous thrombus in the left lesser saphenous vein. Electronically Signed   By: Drusilla Kanner M.D.   On: 03/22/2020 16:31   VAS [place] LOWER EXT VENOUS POST ABLATION  Result Date: 03/16/2020  Lower  Venous Reflux Study Performing Technologist: Debbe Bales RVS  Examination Guidelines: A complete evaluation includes B-mode imaging, spectral Doppler, color Doppler, and power Doppler as needed of all accessible portions of each vessel. Bilateral testing is considered an integral part of a complete examination. Limited examinations for reoccurring indications may be performed as noted. The reflux portion of the exam is performed with the patient in reverse Trendelenburg. Significant venous reflux is defined as >500 ms in the superficial venous system, and >1 second in the deep venous system.  +--------------+---------+------+-----------+------------+--------+  LEFT           Reflux No Reflux Reflux Time Diameter cms Comments                             Yes                                      +--------------+---------+------+-----------+------------+--------+  CFV  no                                                  +--------------+---------+------+-----------+------------+--------+  FV prox        no                                                  +--------------+---------+------+-----------+------------+--------+  FV mid         no                                                  +--------------+---------+------+-----------+------------+--------+  FV dist        no                                                  +--------------+---------+------+-----------+------------+--------+  Popliteal      no                                                  +--------------+---------+------+-----------+------------+--------+  GSV at [facility]     no                                                  +--------------+---------+------+-----------+------------+--------+  GSV prox thigh no                                                  +--------------+---------+------+-----------+------------+--------+  GSV mid thigh  no                                                   +--------------+---------+------+-----------+------------+--------+  GSV dist thigh no                                                  +--------------+---------+------+-----------+------------+--------+  GSV at knee    no                                                  +--------------+---------+------+-----------+------------+--------+  GSV prox calf  no                                                  +--------------+---------+------+-----------+------------+--------+  SSV Pop Fossa  no                                                  +--------------+---------+------+-----------+------------+--------+   Summary: Left: - No evidence of deep vein thrombosis seen in the left lower extremity, from the common femoral through the popliteal veins.  - No evidence of superficial venous thrombosis in the left lower extremity.  - No evidence of superficial venous reflux seen in the left greater saphenous vein.  - No evidence of superficial venous reflux seen in the left short saphenous vein.  - The Left GSV appears to display no evidence of Thrombophlebitis; Vessel compressed and filled with color but no reflux seen. The Left SSV appears to be occluded via Ablation.  *See table(s) above for measurements and observations. Electronically signed by Levora Dredge MD on 03/16/2020 at 4:50:08 PM.    Final      Assessment/Plan 1. Acute deep vein thrombosis (DVT) of popliteal vein of left lower extremity (HCC) Recommend:  The patient has had successful ablation of the previously incompetent saphenous venous system but still has persistent symptoms of pain and swelling that are having a negative impact on daily life and daily activities.  Follow up duplex ultrasound shows successful ablation of the left small saphenous vein which is associated with a small amount of thrombus on the wall of the popliteal vein also patchy areas of STP.  He will continue Eliquis for three more weeks and we will repeat the duplex scan.  At  that time I hope to be able to stop the Eliquis and we can move forward with sclerotherapy.  The patient will continue wearing the graduated compression stockings and using the over-the-counter pain medications to treat her symptoms.   - VAS [place] LOWER EXTREMITY VENOUS (DVT); Future  2. Varicose veins of both lower extremities with pain Recommend:  The patient has had successful ablation of the previously incompetent saphenous venous system but still has persistent symptoms of pain and swelling that are having a negative impact on daily life and daily activities.  Patient should undergo injection sclerotherapy to treat the residual varicosities.  The risks, benefits and alternative therapies were reviewed in detail with the patient.  All questions were answered.  The patient agrees to proceed with sclerotherapy at their convenience.  The patient will continue wearing the graduated compression stockings and using the over-the-counter pain medications to treat her symptoms.   3. Chronic venous insufficiency No surgery or intervention at this point in time.    I have had a long discussion with the patient regarding venous insufficiency and why it  causes symptoms. I have discussed with the patient the chronic skin changes that accompany venous insufficiency and the long term sequela such as infection and ulceration.  Patient will begin wearing graduated compression stockings class 1 (20-30 mmHg) or compression wraps on a daily basis a prescription was given. The patient will put the stockings on first thing in the morning and removing them in the evening. The patient is instructed specifically not to sleep in the stockings.    In addition, behavioral modification including several periods of elevation of the lower extremities during the day will be continued. I have demonstrated that proper elevation is a position with the ankles at heart level.  The patient is instructed to begin routine  exercise, especially walking on a daily basis  4. Essential hypertension Continue antihypertensive medications as already ordered, these medications have been reviewed and there are no changes at this time.   5. Gastroesophageal reflux disease without esophagitis Continue PPI as already ordered, this medication has been reviewed and there are no changes at this time.  Avoidence of caffeine and alcohol  Moderate elevation of the head of the bed     Levora Dredge, MD  03/25/2020 3:00 PM

## 2020-03-26 ENCOUNTER — Encounter (INDEPENDENT_AMBULATORY_CARE_PROVIDER_SITE_OTHER): Payer: Self-pay | Admitting: Vascular Surgery

## 2020-03-26 ENCOUNTER — Ambulatory Visit (INDEPENDENT_AMBULATORY_CARE_PROVIDER_SITE_OTHER): Payer: Managed Care, Other (non HMO) | Admitting: Vascular Surgery

## 2020-03-26 ENCOUNTER — Other Ambulatory Visit (INDEPENDENT_AMBULATORY_CARE_PROVIDER_SITE_OTHER): Payer: Managed Care, Other (non HMO) | Admitting: Vascular Surgery

## 2020-03-26 ENCOUNTER — Other Ambulatory Visit: Payer: Self-pay

## 2020-03-26 ENCOUNTER — Ambulatory Visit (INDEPENDENT_AMBULATORY_CARE_PROVIDER_SITE_OTHER): Payer: Managed Care, Other (non HMO)

## 2020-03-26 VITALS — BP 124/77 | HR 79 | Resp 17 | Ht 72.0 in | Wt 254.0 lb

## 2020-03-26 DIAGNOSIS — Z9889 Other specified postprocedural states: Secondary | ICD-10-CM

## 2020-03-26 DIAGNOSIS — I83813 Varicose veins of bilateral lower extremities with pain: Secondary | ICD-10-CM

## 2020-03-26 DIAGNOSIS — I82432 Acute embolism and thrombosis of left popliteal vein: Secondary | ICD-10-CM

## 2020-03-26 DIAGNOSIS — I872 Venous insufficiency (chronic) (peripheral): Secondary | ICD-10-CM

## 2020-03-26 DIAGNOSIS — I10 Essential (primary) hypertension: Secondary | ICD-10-CM

## 2020-03-26 DIAGNOSIS — I82409 Acute embolism and thrombosis of unspecified deep veins of unspecified lower extremity: Secondary | ICD-10-CM | POA: Insufficient documentation

## 2020-03-26 DIAGNOSIS — K219 Gastro-esophageal reflux disease without esophagitis: Secondary | ICD-10-CM

## 2020-03-27 ENCOUNTER — Other Ambulatory Visit (INDEPENDENT_AMBULATORY_CARE_PROVIDER_SITE_OTHER): Payer: Managed Care, Other (non HMO) | Admitting: Vascular Surgery

## 2020-03-27 ENCOUNTER — Telehealth (INDEPENDENT_AMBULATORY_CARE_PROVIDER_SITE_OTHER): Payer: Self-pay

## 2020-03-27 NOTE — Telephone Encounter (Signed)
Patient left a voicemail informing that he is having pain in the back of his left leg and requesting advice. Patient last seen on yesterday for follow appointment for recent dvt found in left leg. I spoke with Sheppard Plumber NP and she recommended for the patient to take tylenol 1000mg  every 6 hours as needed for pain. Patient was made aware with medical advice and verbalized understanding.

## 2020-03-30 ENCOUNTER — Encounter (INDEPENDENT_AMBULATORY_CARE_PROVIDER_SITE_OTHER): Payer: Managed Care, Other (non HMO)

## 2020-04-16 ENCOUNTER — Other Ambulatory Visit: Payer: Self-pay

## 2020-04-16 ENCOUNTER — Encounter (INDEPENDENT_AMBULATORY_CARE_PROVIDER_SITE_OTHER): Payer: Self-pay | Admitting: Vascular Surgery

## 2020-04-16 ENCOUNTER — Ambulatory Visit (INDEPENDENT_AMBULATORY_CARE_PROVIDER_SITE_OTHER): Payer: Managed Care, Other (non HMO)

## 2020-04-16 ENCOUNTER — Ambulatory Visit (INDEPENDENT_AMBULATORY_CARE_PROVIDER_SITE_OTHER): Payer: Managed Care, Other (non HMO) | Admitting: Vascular Surgery

## 2020-04-16 VITALS — BP 110/73 | HR 83 | Resp 16 | Wt 257.0 lb

## 2020-04-16 DIAGNOSIS — K219 Gastro-esophageal reflux disease without esophagitis: Secondary | ICD-10-CM | POA: Diagnosis not present

## 2020-04-16 DIAGNOSIS — I83813 Varicose veins of bilateral lower extremities with pain: Secondary | ICD-10-CM

## 2020-04-16 DIAGNOSIS — I82432 Acute embolism and thrombosis of left popliteal vein: Secondary | ICD-10-CM

## 2020-04-16 DIAGNOSIS — I10 Essential (primary) hypertension: Secondary | ICD-10-CM

## 2020-04-16 NOTE — Progress Notes (Signed)
MRN : [id number]  Jesus Travis is a 44 y.o. (13-Aug-1976) male who presents with chief complaint of  Chief Complaint  Patient presents with  . Follow-up    ultrasound follow up   .  History of Present Illness:   The patient returns to the office for followup status post laser ablation of the left small  saphenous vein on 03/12/2020.  The patient note significant improvement in the lower extremity pain but not resolution of the symptoms. The patient notes multiple residual varicosities bilaterally which continued to hurt with dependent positions and remained tender to palpation. The patient's swelling is minimally from preoperative status. The patient continues to wear graduated compression stockings on a daily basis but these are not eliminating the pain and discomfort. The patient continues to use over-the-counter anti-inflammatory medications to treat the pain and related symptoms but this has not given the patient relief. The patient notes the pain in the lower extremities is causing problems with daily exercise, problems at work and even with household activities such as preparing meals and doing dishes.  The patient is otherwise done well and there have been no complications related to the laser procedure or interval changes in the patient's overall   Venous ultrasound today shows resolution of the DVT and successful ablation of the left small saphenous vein.    Current Meds  Medication Sig  . apixaban (ELIQUIS) 5 MG TABS tablet Take 2 tablets ( ) twice daily for 7 days, then 1 tablet ( ) twice daily  . carvedilol (COREG) 6.25 MG tablet Take 6.25 mg by mouth 2 (two) times daily.  . ferrous sulfate 325 (65 FE) MG tablet Take 325 mg by mouth daily with breakfast.  . lisinopril-hydrochlorothiazide (PRINZIDE,ZESTORETIC) 20-25 MG tablet Take 1 tablet by mouth daily.  . pantoprazole (PROTONIX) 40 MG tablet TAKE 1 TABLET BY MOUTH ONCE DAILY (NEEDS FOLLOW UP FOR REFILLS OR HAVE PCP  REFILL)  . rosuvastatin (CRESTOR) 10 MG tablet Take 10 mg by mouth daily.  Marland Kitchen spironolactone (ALDACTONE) 25 MG tablet Take 25 mg by mouth daily.  . Vitamin D, Ergocalciferol, (DRISDOL) 1.25 MG (50000 UT) CAPS capsule Take by mouth.    Past Medical History:  Diagnosis Date  . CVA (cerebral infarction) 2011   mild   . Hypertension     Past Surgical History:  Procedure Laterality Date  . WISDOM TOOTH EXTRACTION      Social History Social History   Tobacco Use  . Smoking status: Never Smoker  . Smokeless tobacco: Never Used  Substance Use Topics  . Alcohol use: No  . Drug use: No    Family History Family History  Problem Relation Age of Onset  . Hypertension Mother   . Stroke Mother   . Hypertension Father   . Stroke Maternal Grandmother     No Known Allergies   REVIEW OF SYSTEMS (Negative unless checked)  Constitutional: Weight loss  Fever  Chills Cardiac: Chest pain   Chest pressure   Palpitations   Shortness of breath when laying flat   Shortness of breath with exertion. Vascular:  Pain in legs with walking   Pain in legs at rest  History of DVT   Phlebitis   Swelling in legs   Varicose veins   Non-healing ulcers Pulmonary:   Uses home oxygen   Productive cough   Hemoptysis   Wheeze  COPD   Asthma Neurologic:  Dizziness   Seizures   History of stroke   History of  TIA  Aphasia   Vissual changes   Weakness or numbness in arm   Weakness or numbness in leg Musculoskeletal:   Joint swelling   Joint pain   Low back pain Hematologic:  Easy bruising  Easy bleeding   Hypercoagulable state   Anemic Gastrointestinal:  Diarrhea   Vomiting  Gastroesophageal reflux/heartburn   Difficulty swallowing. Genitourinary:  Chronic kidney disease   Difficult urination  Frequent urination   Blood in urine Skin:  Rashes   Ulcers  Psychological:  History of anxiety    History of major  depression.  Physical Examination  Vitals:   04/16/20 1034  BP: 110/73  Pulse: 83  Resp: 16  Weight: 257 lb (116.6 kg)   Body mass index is 34.86 kg/m. Gen: WD/WN, NAD Head: [place]/AT, No temporalis wasting.  Ear/Nose/Throat: Hearing grossly intact, nares w/o erythema or drainage Eyes: PER, EOMI, sclera nonicteric.  Neck: Supple, no large masses.   Pulmonary:  Good air movement, no audible wheezing bilaterally, no use of accessory muscles.  Cardiac: RRR, no JVD Vascular:  Large tender varicosities left calf >10 mm Vessel Right Left  Radial Palpable Palpable  Gastrointestinal: Non-distended. No guarding/no peritoneal signs.  Musculoskeletal: M/S 5/5 throughout.  No deformity or atrophy.  Neurologic: CN 2-12 intact. Symmetrical.  Speech is fluent. Motor exam as listed above. Psychiatric: Judgment intact, Mood & affect appropriate for pt's clinical situation. Dermatologic: No rashes or ulcers noted.  No changes consistent with cellulitis.   CBC Lab Results  Component Value Date   WBC 5.1 06/22/2017   HGB 12.7 (L) 06/22/2017   HCT 39.9 (L) 06/22/2017   MCV 70.2 (L) 06/22/2017   PLT 304 06/22/2017    BMET    Component Value Date/Time   NA 140 06/22/2017 1731   K 3.8 06/22/2017 1731   CL 101 06/22/2017 1731   CO2 29 06/22/2017 1731   GLUCOSE 89 06/22/2017 1731   BUN 24 (H) 06/22/2017 1731   CREATININE 0.94 06/22/2017 1731   CALCIUM 9.5 06/22/2017 1731   GFRNONAA >60 06/22/2017 1731   GFRAA >60 06/22/2017 1731   CrCl cannot be calculated (Patient's most recent lab result is older than the maximum 21 days allowed.).  COAG No results found for: INR, PROTIME  Radiology US Venous Img Lower Unilateral Left  Result Date: 03/22/2020 CLINICAL DATA:  Left calf pain and swelling for 1 week. EXAM: LEFT LOWER EXTREMITY VENOUS DOPPLER ULTRASOUND TECHNIQUE: Gray-scale sonography with compression, as well as color and duplex ultrasound, were performed to evaluate the deep venous  system(s) from the level of the common femoral vein through the popliteal and proximal calf veins. COMPARISON:  None. FINDINGS: VENOUS Nonocclusive thrombus is seen in the proximal aspect of the left popliteal vein. There is also occlusive thrombus in the left lesser saphenous vein. Imaged veins are otherwise negative. Limited views of the contralateral common femoral vein are unremarkable. OTHER None. Limitations: none IMPRESSION: The examination is positive for nonocclusive deep venous thrombus in the left popliteal vein and occlusive superficial venous thrombus in the left lesser saphenous vein. Electronically Signed   By: Drusilla Kanner M.D.   On: 03/22/2020 16:31   VAS [place] LOWER EXTREMITY VENOUS (DVT)  Result Date: 03/30/2020  Lower Venous DVTStudy Indications: Left popliteal DVT.  Risk Factors: Surgery Left SSV laser ablation on 03/12/20. Comparison Study: Left Post-ablation [place] on 03/16/20: Occluded left SSV with no  DVT;                   Left leg DVT on 03/22/20 at [facility]: Non-occlusive thrombus in                   left popliteal & occlusive thrombus in SSV; Performing Technologist: Jamse Mead RT, RDMS, RVT  Examination Guidelines: A complete evaluation includes B-mode imaging, spectral Doppler, color Doppler, and power Doppler as needed of all accessible portions of each vessel. Bilateral testing is considered an integral part of a complete examination. Limited examinations for reoccurring indications may be performed as noted. The reflux portion of the exam is performed with the patient in reverse Trendelenburg.  +----------------+---------------+---------+-----------+----------+------------+ LEFT            CompressibilityPhasicitySpontaneityPropertiesThrombus                                                                  Aging        +----------------+---------------+---------+-----------+----------+------------+ CFV             Full           Yes      Yes                                +----------------+---------------+---------+-----------+----------+------------+ SFJ             Full           Yes      Yes                               +----------------+---------------+---------+-----------+----------+------------+ FV Prox         Full                                                      +----------------+---------------+---------+-----------+----------+------------+ FV Mid          Full           Yes      Yes                               +----------------+---------------+---------+-----------+----------+------------+ FV Distal       Full                                                      +----------------+---------------+---------+-----------+----------+------------+ POP             Partial        No       No                   Acute        +----------------+---------------+---------+-----------+----------+------------+ PTV             Full                                                      +----------------+---------------+---------+-----------+----------+------------+  PERO            Full                                                      +----------------+---------------+---------+-----------+----------+------------+ Gastroc         Full                                                      +----------------+---------------+---------+-----------+----------+------------+ GSV             Full           Yes      Yes                               +----------------+---------------+---------+-----------+----------+------------+ SSV             None                                         Recent                                                                    ablation     +----------------+---------------+---------+-----------+----------+------------+ Varicosities offPartial                                      Acute        prox/mid SSV                                                               +----------------+---------------+---------+-----------+----------+------------+  Summary: LEFT: - Occluded left SSV from the mid calf to SFJ which is consistant with patient laser ablation performed earlier this month. Large varicosities adjacent to the proximal/mid calf SSV appear occlsuive as well. - There is partially-occlusive thrombus in the proximal/mid popliteal vein adjacent and superior to the saphenopopliteal junction, with reduced flow phasicity/spontaneity distally, that was not noted on the post-ablation study from 03/16/20. - No DVT noted in the remainder of the left lower extremity.  *See table(s) above for measurements and observations. Electronically signed by Levora Dredge MD on 03/30/2020 at 2:21:02 PM.    Final      Assessment/Plan 1. Varicose veins of both lower extremities with pain Recommend:  The patient has had successful ablation of the previously incompetent saphenous venous system but still has persistent symptoms of pain and swelling that are having a negative impact on daily life and daily activities.  Patient should undergo injection sclerotherapy to treat the residual varicosities.  The  risks, benefits and alternative therapies were reviewed in detail with the patient.  All questions were answered.  The patient agrees to proceed with sclerotherapy at their convenience.  The patient will continue wearing the graduated compression stockings and using the over-the-counter pain medications to treat her symptoms.    2. Essential hypertension Continue antihypertensive medications as already ordered, these medications have been reviewed and there are no changes at this time.   3. Acute deep vein thrombosis (DVT) of popliteal vein of left lower extremity (HCC) Resolved he will finish his Eliquis and then DC this medication  4. Gastroesophageal reflux disease without esophagitis Continue PPI as already ordered, this medication has been reviewed and  there are no changes at this time.  Avoidence of caffeine and alcohol  Moderate elevation of the head of the bed    Levora Dredge, MD  04/16/2020 10:43 AM

## 2020-05-20 ENCOUNTER — Ambulatory Visit (INDEPENDENT_AMBULATORY_CARE_PROVIDER_SITE_OTHER): Payer: Managed Care, Other (non HMO) | Admitting: Vascular Surgery

## 2020-05-20 ENCOUNTER — Other Ambulatory Visit: Payer: Self-pay

## 2020-05-20 ENCOUNTER — Encounter (INDEPENDENT_AMBULATORY_CARE_PROVIDER_SITE_OTHER): Payer: Self-pay | Admitting: Vascular Surgery

## 2020-05-20 VITALS — BP 128/78 | HR 84 | Ht 72.0 in | Wt 254.0 lb

## 2020-05-20 DIAGNOSIS — I83813 Varicose veins of bilateral lower extremities with pain: Secondary | ICD-10-CM | POA: Diagnosis not present

## 2020-05-20 DIAGNOSIS — I872 Venous insufficiency (chronic) (peripheral): Secondary | ICD-10-CM

## 2020-05-20 NOTE — Progress Notes (Signed)
Patient with larger varicosities under the skin not amenable to saline sclerotherapy. Attempted mutliple areas with saline sclerotherapy and was unsuccessful. Will bring patient back and use ultrasound and possible foam sclerotherapy for better results.

## 2020-06-03 ENCOUNTER — Telehealth (INDEPENDENT_AMBULATORY_CARE_PROVIDER_SITE_OTHER): Payer: Self-pay

## 2020-06-03 NOTE — Telephone Encounter (Signed)
He can stop the aspirin.

## 2020-06-03 NOTE — Telephone Encounter (Signed)
Pt called and left a message on the nurses line an was called back  He wants to know since he was put on 500 mg  Asprin after finishing eliquis in august  An was told to take it for a month he wants to know now what should be his next steps as far as continuing the Asprin or just what.  He said that he stopped staking the saprin a week ago when he reached his month mark.

## 2020-06-04 NOTE — Telephone Encounter (Signed)
I called and made the pt aware of the NP's instructions.

## 2020-06-08 ENCOUNTER — Other Ambulatory Visit: Payer: Self-pay

## 2020-06-08 ENCOUNTER — Encounter (INDEPENDENT_AMBULATORY_CARE_PROVIDER_SITE_OTHER): Payer: Self-pay | Admitting: Vascular Surgery

## 2020-06-08 ENCOUNTER — Ambulatory Visit (INDEPENDENT_AMBULATORY_CARE_PROVIDER_SITE_OTHER): Payer: Managed Care, Other (non HMO) | Admitting: Vascular Surgery

## 2020-06-08 VITALS — BP 123/77 | HR 94 | Ht 72.0 in | Wt 257.0 lb

## 2020-06-08 DIAGNOSIS — I83813 Varicose veins of bilateral lower extremities with pain: Secondary | ICD-10-CM | POA: Diagnosis not present

## 2020-06-09 ENCOUNTER — Encounter (INDEPENDENT_AMBULATORY_CARE_PROVIDER_SITE_OTHER): Payer: Self-pay | Admitting: Vascular Surgery

## 2020-06-09 NOTE — Progress Notes (Signed)
  Jesus Travis is a 44 y.o.male who presents with painful varicose veins of the left leg  Past Medical History:  Diagnosis Date  . CVA (cerebral infarction) 2011   mild   . Hypertension     Past Surgical History:  Procedure Laterality Date  . WISDOM TOOTH EXTRACTION      Current Outpatient Medications  Medication Sig Dispense Refill  . apixaban (ELIQUIS) 5 MG TABS tablet Take 2 tablets (10mg ) twice daily for 7 days, then 1 tablet (5mg ) twice daily 60 tablet 0  . aspirin 325 MG EC tablet Take 325 mg by mouth daily.    . carvedilol (COREG CR) 20 MG 24 hr capsule Take 20 mg by mouth daily.    . carvedilol (COREG) 6.25 MG tablet Take 6.25 mg by mouth 2 (two) times daily.    . famotidine (PEPCID) 20 MG tablet Take 1 tablet (20 mg total) by mouth 2 (two) times daily. 60 tablet 1  . ferrous sulfate 325 (65 FE) MG tablet Take 325 mg by mouth daily with breakfast.    . lisinopril-hydrochlorothiazide (PRINZIDE,ZESTORETIC) 20-25 MG tablet Take 1 tablet by mouth daily.    . metroNIDAZOLE (FLAGYL) 500 MG tablet Take 1 tablet (500 mg total) by mouth 2 (two) times daily. 14 tablet 0  . pantoprazole (PROTONIX) 40 MG tablet TAKE 1 TABLET BY MOUTH ONCE DAILY (NEEDS FOLLOW UP FOR REFILLS OR HAVE PCP REFILL)  1  . rosuvastatin (CRESTOR) 10 MG tablet Take 10 mg by mouth daily.    spironolactone (ALDACTONE) 25 MG tablet Take 25 mg by mouth daily.    . Vitamin D, Ergocalciferol, (DRISDOL) 1.25 MG (50000 UT) CAPS capsule Take by mouth.    buPROPion (WELLBUTRIN XL) 150 MG 24 hr tablet Take by mouth.    . sertraline (ZOLOFT) 25 MG tablet Take 25 mg by mouth daily.     No current facility-administered medications for this visit.    No Known Allergies  Indication: Patient presents with symptomatic varicose veins of the left lower extremity.  Procedure: Foam sclerotherapy was performed on the left lower extremity. Using ultrasound guidance, 5 mL of foam Sotradecol was used to inject the  varicosities of the left lower extremity. Compression wraps were placed. The patient tolerated the procedure well.

## 2020-06-16 ENCOUNTER — Other Ambulatory Visit: Payer: Self-pay

## 2020-06-16 ENCOUNTER — Emergency Department
Admission: EM | Admit: 2020-06-16 | Discharge: 2020-06-17 | Disposition: A | Discharge status: Home / Self Care | Payer: Managed Care, Other (non HMO) | Attending: Emergency Medicine | Admitting: Emergency Medicine

## 2020-06-16 DIAGNOSIS — Z7982 Long term (current) use of aspirin: Secondary | ICD-10-CM | POA: Diagnosis not present

## 2020-06-16 DIAGNOSIS — R5383 Other fatigue: Secondary | ICD-10-CM | POA: Insufficient documentation

## 2020-06-16 DIAGNOSIS — Z79899 Other long term (current) drug therapy: Secondary | ICD-10-CM | POA: Insufficient documentation

## 2020-06-16 DIAGNOSIS — I10 Essential (primary) hypertension: Secondary | ICD-10-CM | POA: Diagnosis not present

## 2020-06-16 DIAGNOSIS — Z7901 Long term (current) use of anticoagulants: Secondary | ICD-10-CM | POA: Insufficient documentation

## 2020-06-16 DIAGNOSIS — R519 Headache, unspecified: Secondary | ICD-10-CM | POA: Insufficient documentation

## 2020-06-16 DIAGNOSIS — M791 Myalgia, unspecified site: Secondary | ICD-10-CM | POA: Insufficient documentation

## 2020-06-16 DIAGNOSIS — Z20822 Contact with and (suspected) exposure to covid-19: Secondary | ICD-10-CM | POA: Insufficient documentation

## 2020-06-16 LAB — COMPREHENSIVE METABOLIC PANEL
ALT: 48 U/L — ABNORMAL HIGH (ref 0–44)
AST: 25 U/L (ref 15–41)
Albumin: 4.3 g/dL (ref 3.5–5.0)
Alkaline Phosphatase: 38 U/L (ref 38–126)
Anion gap: 10 (ref 5–15)
BUN: 16 mg/dL (ref 6–20)
CO2: 26 mmol/L (ref 22–32)
Calcium: 9.1 mg/dL (ref 8.9–10.3)
Chloride: 99 mmol/L (ref 98–111)
Creatinine, Ser: 0.86 mg/dL (ref 0.61–1.24)
GFR, Estimated: >60 mL/min (ref >60)
Glucose, Bld: 117 mg/dL — ABNORMAL HIGH (ref 70–99)
Potassium: 3.6 mmol/L (ref 3.5–5.1)
Sodium: 135 mmol/L (ref 135–145)
Total Bilirubin: 1 mg/dL (ref 0.3–1.2)
Total Protein: 7.8 g/dL (ref 6.5–8.1)

## 2020-06-16 LAB — CBC
HCT: 39.6 % (ref 39.0–52.0)
Hemoglobin: 12.7 g/dL — ABNORMAL LOW (ref 13.0–17.0)
MCH: 22.8 pg — ABNORMAL LOW (ref 26.0–34.0)
MCHC: 32.1 g/dL (ref 30.0–36.0)
MCV: 71.1 fL — ABNORMAL LOW (ref 80.0–100.0)
Platelets: 294 10*3/uL (ref 150–400)
RBC: 5.57 MIL/uL (ref 4.22–5.81)
RDW: 13.8 % (ref 11.5–15.5)
WBC: 5.6 10*3/uL (ref 4.0–10.5)
nRBC: 0 % (ref 0.0–0.2)

## 2020-06-16 LAB — TROPONIN I (HIGH SENSITIVITY): Troponin I (High Sensitivity): 3 ng/L (ref <18)

## 2020-06-16 NOTE — ED Triage Notes (Addendum)
Pt In with co generalized body aches headache and fatigue for a few days. States did receive 3rd covid shot last week. Pt is on bp meds and has taken it today.

## 2020-06-17 ENCOUNTER — Ambulatory Visit (INDEPENDENT_AMBULATORY_CARE_PROVIDER_SITE_OTHER): Payer: Managed Care, Other (non HMO) | Admitting: Vascular Surgery

## 2020-06-17 LAB — URINALYSIS, COMPLETE (UACMP) WITH MICROSCOPIC
Bilirubin Urine: NEGATIVE
Glucose, UA: NEGATIVE mg/dL
Hgb urine dipstick: NEGATIVE
Ketones, ur: NEGATIVE mg/dL
Leukocytes,Ua: NEGATIVE
Nitrite: NEGATIVE
Protein, ur: NEGATIVE mg/dL
Specific Gravity, Urine: 1.027 (ref 1.005–1.030)
pH: 5 (ref 5.0–8.0)

## 2020-06-17 LAB — RESPIRATORY PANEL BY RT PCR (FLU A&B, COVID)
Influenza A by PCR: NEGATIVE
Influenza B by PCR: NEGATIVE
SARS Coronavirus 2 by RT PCR: NEGATIVE

## 2020-06-17 LAB — CK: Total CK: 255 U/L (ref 49–397)

## 2020-06-17 MED ORDER — IBUPROFEN 400 MG PO TABS
400.0000 mg | ORAL_TABLET | Freq: Once | ORAL | Status: AC
  Administered 2020-06-17: 400 mg via ORAL
  Filled 2020-06-17: qty 1

## 2020-06-17 NOTE — ED Provider Notes (Signed)
[facility] Emergency Department Provider Note  ____________________________________________   First MD Initiated Contact with Patient 06/16/20 2343     (approximate)  I have reviewed the triage vital signs and the nursing notes.   HISTORY  Chief Complaint Headache and Generalized Body Aches   HPI Jesus Travis is a 44 y.o. male with below list of previous medical conditions including CVA hypertension DVT presents to the emergency department secondary to headache, generalized body aches, fatigue after receiving his third Covid vaccine last week.  Patient denies any focal weakness including lower extremity.  Patient denies any decreased sensation.  Patient denies any difficulty with breathing.  Patient denies any cough.  Patient denies any fever.  Patient denies any urinary symptoms.  Patient denies any abdominal pain        Past Medical History:  Diagnosis Date  . CVA (cerebral infarction) 2011   mild   . Hypertension     Patient Active Problem List   Diagnosis Date Noted  . DVT (deep venous thrombosis) (HCC) 03/26/2020  . Chickenpox 07/12/2018  . Vitamin D deficiency 07/09/2018  . Varicose veins of both lower extremities with pain 12/16/2017  . Chronic venous insufficiency 12/16/2017  . Lymphedema 12/16/2017  . Essential hypertension 12/16/2017  . GERD (gastroesophageal reflux disease) 12/16/2017    Past Surgical History:  Procedure Laterality Date  . WISDOM TOOTH EXTRACTION      Prior to Admission medications   Medication Sig Start Date End Date Taking? Authorizing Provider  apixaban (ELIQUIS) 5 MG TABS tablet Take 2 tablets (10mg ) twice daily for 7 days, then 1 tablet (5mg ) twice daily 03/22/20   , MD  aspirin 325 MG EC tablet Take 325 mg by mouth daily.    [provider]  buPROPion (WELLBUTRIN XL) 150 MG 24 hr tablet Take by mouth. 06/11/18 06/11/19  [provider]  carvedilol (COREG CR) 20 MG 24  hr capsule Take 20 mg by mouth daily. 05/23/20   [provider]  carvedilol (COREG) 6.25 MG tablet Take 6.25 mg by mouth 2 (two) times daily. 12/20/19   [provider]  famotidine (PEPCID) 20 MG tablet Take 1 tablet (20 mg total) by mouth 2 (two) times daily. 03/16/16   12/22/19, MD  ferrous sulfate 325 (65 FE) MG tablet Take 325 mg by mouth daily with breakfast.    [provider]  lisinopril-hydrochlorothiazide (PRINZIDE,ZESTORETIC) 20-25 MG tablet Take 1 tablet by mouth daily. 06/19/17   [provider]  metroNIDAZOLE (FLAGYL) 500 MG tablet Take 1 tablet (500 mg total) by mouth 2 (two) times daily. 05/24/18   06/21/17, MD  pantoprazole (PROTONIX) 40 MG tablet TAKE 1 TABLET BY MOUTH ONCE DAILY (NEEDS FOLLOW UP FOR REFILLS OR HAVE PCP REFILL) 04/25/18   [provider]  rosuvastatin (CRESTOR) 10 MG tablet Take 10 mg by mouth daily. 03/26/20   [provider]  sertraline (ZOLOFT) 25 MG tablet Take 25 mg by mouth daily. 12/22/16 12/22/17  [provider]  spironolactone (ALDACTONE) 25 MG tablet Take 25 mg by mouth daily. 02/20/20   [provider]  Vitamin D, Ergocalciferol, (DRISDOL) 1.25 MG (50000 UT) CAPS capsule Take by mouth. 06/13/18   [provider]    Allergies Patient has no known allergies.  Family History  Problem Relation Age of Onset  . Hypertension Mother   . Stroke Mother   . Hypertension Father   . Stroke Maternal Grandmother  Social History Social History   Tobacco Use  . Smoking status: Never Smoker  . Smokeless tobacco: Never Used  Substance Use Topics  . Alcohol use: No  . Drug use: No    Review of Systems Constitutional: No fever/chills Eyes: No visual changes. ENT: No sore throat. Cardiovascular: Denies chest pain. Respiratory: Denies shortness of breath. Gastrointestinal: No abdominal pain.  No nausea, no vomiting.  No diarrhea.  No  constipation. Genitourinary: Negative for dysuria. Musculoskeletal: Negative for neck pain.  Negative for back pain. Integumentary: Negative for rash. Neurological: Is it for headache and generalized fatigue and myalgia   ____________________________________________   PHYSICAL EXAM:  VITAL SIGNS: ED Triage Vitals  Enc Vitals Group     BP 06/16/20 2029 (!) 158/131     Pulse Rate 06/16/20 2029 97     Resp 06/16/20 2029 18     Temp 06/16/20 2029 98.8 F (37.1 C)     Temp Source 06/16/20 2029 Oral     SpO2 06/16/20 2029 99 %     Weight 06/16/20 2030 115.2 kg (254 lb)     Height 06/16/20 2030 1.829 m (6')     Head Circumference --      Peak Flow --      Pain Score 06/16/20 2030 7     Pain Loc --      Pain Edu? --      Excl. in GC? --     Constitutional: Alert and oriented.  Eyes: Conjunctivae are normal.  Head: Atraumatic. Mouth/Throat: Patient is wearing a mask. Neck: No stridor.  No meningeal signs.   Cardiovascular: Normal rate, regular rhythm. Good peripheral circulation. Grossly normal heart sounds. Respiratory: Normal respiratory effort.  No retractions. Gastrointestinal: Soft and nontender. No distention.  Musculoskeletal: No lower extremity tenderness nor edema. No gross deformities of extremities. Neurologic:  Normal speech and language. No gross focal neurologic deficits are appreciated.  5 out of 5 bilateral lower extremity muscle strength.  Normal deep tendon reflexes bilaterally. Skin:  Skin is warm, dry and intact. Psychiatric: Mood and affect are normal. Speech and behavior are normal.  ____________________________________________   LABS (all labs ordered are listed, but only abnormal results are displayed)  Labs Reviewed  CBC - Abnormal; Notable for the following components:      Result Value   Hemoglobin 12.7 (*)    MCV 71.1 (*)    MCH 22.8 (*)    All other components within normal limits  COMPREHENSIVE METABOLIC PANEL - Abnormal; Notable for the  following components:   Glucose, Bld 117 (*)    ALT 48 (*)    All other components within normal limits  URINALYSIS, COMPLETE (UACMP) WITH MICROSCOPIC - Abnormal; Notable for the following components:   Color, Urine YELLOW (*)    APPearance CLEAR (*)    Bacteria, UA RARE (*)    All other components within normal limits  RESPIRATORY PANEL BY RT PCR (FLU A&B, COVID)  CK  TROPONIN I (HIGH SENSITIVITY)  TROPONIN I (HIGH SENSITIVITY)   ____________________________________________  EKG  ED ECG REPORT I, [place] N Chassidy Layson, the attending physician, personally viewed and interpreted this ECG.   Date: 06/17/2020  EKG Time: 8:25 PM  Rate: 99  Rhythm: Normal sinus rhythm  Axis: Normal  Intervals: Normal  ST&T Change: None  ____________________________________________    Procedures   ____________________________________________   INITIAL IMPRESSION / MDM / ASSESSMENT AND PLAN / ED COURSE  As part of my medical decision making, I reviewed  the following data within the electronic MEDICAL RECORD NUMBER   44 year old male presented with above-stated history and physical exam consistent with possible myalgia fatigue secondary to COVID-19 vaccine (third dose).  However consider possibility of infectious etiology and as such laboratory data was obtained which is reassuring including urinalysis.  Consider the possibility of Guillain-Barr however patient without any clinical findings consistent with Guillain-Barr.  No lower extremity weakness or difficulty with ambulation or any other weakness.  No  loss of sensation.  Patient be discharged home with recommendation to follow-up with primary care provider.  ____________________________________________  FINAL CLINICAL IMPRESSION(S) / ED DIAGNOSES  Final diagnoses:  Myalgia     MEDICATIONS GIVEN DURING THIS VISIT:  Medications  ibuprofen (ADVIL) tablet 400 mg (400 mg Oral Given 06/17/20 0048)     ED Discharge Orders    None       *Please note:  Jesus Travis was evaluated in Emergency Department on 06/17/2020 for the symptoms described in the history of present illness. He was evaluated in the context of the global COVID-19 pandemic, which necessitated consideration that the patient might be at risk for infection with the SARS-CoV-2 virus that causes COVID-19. Institutional protocols and algorithms that pertain to the evaluation of patients at risk for COVID-19 are in a state of rapid change based on information released by regulatory bodies including the CDC and federal and state organizations. These policies and algorithms were followed during the patient's care in the ED.  Some ED evaluations and interventions may be delayed as a result of limited staffing during and after the pandemic.*  Note:  This document was prepared using Dragon voice recognition software and may include unintentional dictation errors.   Darci Current, MD 06/17/20 [phone]

## 2020-07-09 ENCOUNTER — Ambulatory Visit (INDEPENDENT_AMBULATORY_CARE_PROVIDER_SITE_OTHER): Payer: Managed Care, Other (non HMO) | Admitting: Vascular Surgery

## 2020-07-15 ENCOUNTER — Ambulatory Visit (INDEPENDENT_AMBULATORY_CARE_PROVIDER_SITE_OTHER): Payer: Managed Care, Other (non HMO) | Admitting: Vascular Surgery

## 2020-07-16 ENCOUNTER — Other Ambulatory Visit: Payer: Self-pay

## 2020-07-16 ENCOUNTER — Encounter (INDEPENDENT_AMBULATORY_CARE_PROVIDER_SITE_OTHER): Payer: Self-pay | Admitting: Vascular Surgery

## 2020-07-16 ENCOUNTER — Ambulatory Visit (INDEPENDENT_AMBULATORY_CARE_PROVIDER_SITE_OTHER): Payer: Managed Care, Other (non HMO) | Admitting: Vascular Surgery

## 2020-07-16 VITALS — BP 118/77 | HR 93 | Ht 74.0 in | Wt 256.0 lb

## 2020-07-16 DIAGNOSIS — I83813 Varicose veins of bilateral lower extremities with pain: Secondary | ICD-10-CM

## 2020-07-18 ENCOUNTER — Encounter (INDEPENDENT_AMBULATORY_CARE_PROVIDER_SITE_OTHER): Payer: Self-pay | Admitting: Vascular Surgery

## 2020-07-18 NOTE — Progress Notes (Signed)
No sclerotherapy was performed therefore no charge  Patient will follow up PRN

## 2020-08-06 ENCOUNTER — Ambulatory Visit (INDEPENDENT_AMBULATORY_CARE_PROVIDER_SITE_OTHER): Payer: Managed Care, Other (non HMO) | Admitting: Vascular Surgery

## 2021-06-04 ENCOUNTER — Ambulatory Visit
Admission: RE | Admit: 2021-06-04 | Discharge: 2021-06-04 | Disposition: A | Discharge status: Home / Self Care | Payer: Managed Care, Other (non HMO) | Attending: Ophthalmology | Admitting: Ophthalmology

## 2021-06-04 ENCOUNTER — Ambulatory Visit
Admission: RE | Admit: 2021-06-04 | Discharge: 2021-06-04 | Disposition: A | Discharge status: Home / Self Care | Payer: Managed Care, Other (non HMO) | Source: Ambulatory Visit | Attending: Ophthalmology | Admitting: Ophthalmology

## 2021-06-04 ENCOUNTER — Other Ambulatory Visit
Admission: RE | Admit: 2021-06-04 | Discharge: 2021-06-04 | Disposition: A | Discharge status: Home / Self Care | Payer: Managed Care, Other (non HMO) | Source: Home / Self Care | Attending: Ophthalmology | Admitting: Ophthalmology

## 2021-06-04 ENCOUNTER — Other Ambulatory Visit: Payer: Self-pay | Admitting: Ophthalmology

## 2021-06-04 DIAGNOSIS — G4730 Sleep apnea, unspecified: Secondary | ICD-10-CM | POA: Insufficient documentation

## 2021-06-04 DIAGNOSIS — H2012 Chronic iridocyclitis, left eye: Secondary | ICD-10-CM | POA: Diagnosis present

## 2021-06-04 LAB — CBC WITH DIFFERENTIAL/PLATELET
Abs Immature Granulocytes: 0.02 10*3/uL (ref 0.00–0.07)
Basophils Absolute: 0 10*3/uL (ref 0.0–0.1)
Basophils Relative: 0 %
Eosinophils Absolute: 0 10*3/uL (ref 0.0–0.5)
Eosinophils Relative: 0 %
HCT: 35.3 % — ABNORMAL LOW (ref 39.0–52.0)
Hemoglobin: 11.4 g/dL — ABNORMAL LOW (ref 13.0–17.0)
Immature Granulocytes: 0 %
Lymphocytes Relative: 33 %
Lymphs Abs: 1.7 10*3/uL (ref 0.7–4.0)
MCH: 23.7 pg — ABNORMAL LOW (ref 26.0–34.0)
MCHC: 32.3 g/dL (ref 30.0–36.0)
MCV: 73.2 fL — ABNORMAL LOW (ref 80.0–100.0)
Monocytes Absolute: 0.4 10*3/uL (ref 0.1–1.0)
Monocytes Relative: 7 %
Neutro Abs: 3 10*3/uL (ref 1.7–7.7)
Neutrophils Relative %: 60 %
Platelets: 329 10*3/uL (ref 150–400)
RBC: 4.82 MIL/uL (ref 4.22–5.81)
RDW: 14.1 % (ref 11.5–15.5)
WBC: 5 10*3/uL (ref 4.0–10.5)
nRBC: 0 % (ref 0.0–0.2)

## 2021-06-04 LAB — SEDIMENTATION RATE: Sed Rate: 30 mm/hr — ABNORMAL HIGH (ref 0–15)

## 2021-06-05 LAB — RHEUMATOID FACTOR: Rheumatoid fact SerPl-aCnc: <10 IU/mL (ref <14.0)

## 2021-06-05 LAB — RPR: RPR Ser Ql: NONREACTIVE

## 2021-06-05 LAB — ANGIOTENSIN CONVERTING ENZYME: Angiotensin-Converting Enzyme: 6 U/L — ABNORMAL LOW (ref 14–82)

## 2021-06-09 LAB — QUANTIFERON-TB GOLD PLUS (RQFGPL)
QuantiFERON Mitogen Value: >10 IU/mL
QuantiFERON Nil Value: 0.01 IU/mL
QuantiFERON TB1 Ag Value: 0.05 IU/mL
QuantiFERON TB2 Ag Value: 0.01 IU/mL

## 2021-06-09 LAB — QUANTIFERON-TB GOLD PLUS: QuantiFERON-TB Gold Plus: NEGATIVE

## 2021-06-11 LAB — HLA-B27 ANTIGEN: HLA-B27: NEGATIVE

## 2021-09-04 ENCOUNTER — Emergency Department
Admission: EM | Admit: 2021-09-04 | Discharge: 2021-09-04 | Disposition: A | Discharge status: Home / Self Care | Payer: Managed Care, Other (non HMO) | Attending: Emergency Medicine | Admitting: Emergency Medicine

## 2021-09-04 ENCOUNTER — Other Ambulatory Visit: Payer: Self-pay

## 2021-09-04 ENCOUNTER — Encounter: Payer: Self-pay | Admitting: Emergency Medicine

## 2021-09-04 DIAGNOSIS — U071 COVID-19: Secondary | ICD-10-CM | POA: Diagnosis not present

## 2021-09-04 DIAGNOSIS — Z7901 Long term (current) use of anticoagulants: Secondary | ICD-10-CM | POA: Diagnosis not present

## 2021-09-04 DIAGNOSIS — I10 Essential (primary) hypertension: Secondary | ICD-10-CM | POA: Diagnosis not present

## 2021-09-04 DIAGNOSIS — Z7982 Long term (current) use of aspirin: Secondary | ICD-10-CM | POA: Diagnosis not present

## 2021-09-04 DIAGNOSIS — Z79899 Other long term (current) drug therapy: Secondary | ICD-10-CM | POA: Diagnosis not present

## 2021-09-04 DIAGNOSIS — J069 Acute upper respiratory infection, unspecified: Secondary | ICD-10-CM | POA: Insufficient documentation

## 2021-09-04 DIAGNOSIS — R059 Cough, unspecified: Secondary | ICD-10-CM | POA: Diagnosis present

## 2021-09-04 MED ORDER — PSEUDOEPH-BROMPHEN-DM 30-2-10 MG/5ML PO SYRP: 5.0000 mL | ORAL_SOLUTION | Freq: Four times a day (QID) | ORAL | 0 refills | Status: DC | PRN

## 2021-09-04 MED ORDER — IBUPROFEN 600 MG PO TABS
600.0000 mg | ORAL_TABLET | Freq: Once | ORAL | Status: AC
  Administered 2021-09-04: 600 mg via ORAL
  Filled 2021-09-04: qty 1

## 2021-09-04 MED ORDER — BENZONATATE 100 MG PO CAPS
200.0000 mg | ORAL_CAPSULE | Freq: Once | ORAL | Status: AC
  Administered 2021-09-04: 200 mg via ORAL
  Filled 2021-09-04: qty 2

## 2021-09-04 NOTE — ED Provider Notes (Signed)
[facility] Emergency Department Provider Note   ____________________________________________   Event Date/Time   First MD Initiated Contact with Patient 09/04/21 1714     (approximate)  I have reviewed the triage vital signs and the nursing notes.   HISTORY  Chief Complaint Cough    HPI Jesus Travis is a 45 y.o. male patient presents with 5 days of nasal congestion, chest congestion, nonproductive cough, and loss of taste and smell.  Patient denies recent travel.  Patient stated he recently was notified that his son tested positive for COVID-19.  Patient  taken to COVID vaccines and booster.  Patient's wife is here with the same complaint.         Past Medical History:  Diagnosis Date   CVA (cerebral infarction) 2011   mild    Hypertension     Patient Active Problem List   Diagnosis Date Noted   DVT (deep venous thrombosis) (HCC) 03/26/2020   Chickenpox 07/12/2018   Vitamin D deficiency 07/09/2018   Varicose veins of both lower extremities with pain 12/16/2017   Chronic venous insufficiency 12/16/2017   Lymphedema 12/16/2017   Essential hypertension 12/16/2017   GERD (gastroesophageal reflux disease) 12/16/2017    Past Surgical History:  Procedure Laterality Date   WISDOM TOOTH EXTRACTION      Prior to Admission medications   Medication Sig Start Date End Date Taking? Authorizing Provider  brompheniramine-pseudoephedrine-DM 30-2-10 MG/5ML syrup Take 5 mLs by mouth 4 (four) times daily as needed. 09/04/21  Yes Joni Reining, PA-C  apixaban (ELIQUIS) 5 MG TABS tablet Take 2 tablets (10mg ) twice daily for 7 days, then 1 tablet (5mg ) twice daily 03/22/20   , MD  aspirin 325 MG EC tablet Take 325 mg by mouth daily.    [provider]  buPROPion (WELLBUTRIN XL) 150 MG 24 hr tablet Take by mouth. 06/11/18 06/11/19  [provider]  carvedilol (COREG CR) 20 MG 24 hr capsule Take 20 mg by mouth daily.  05/23/20   [provider]  carvedilol (COREG) 6.25 MG tablet Take 6.25 mg by mouth 2 (two) times daily. 12/20/19   [provider]  famotidine (PEPCID) 20 MG tablet Take 1 tablet (20 mg total) by mouth 2 (two) times daily. 03/16/16   12/22/19, MD  ferrous sulfate 325 (65 FE) MG tablet Take 325 mg by mouth daily with breakfast.    [provider]  lisinopril-hydrochlorothiazide (PRINZIDE,ZESTORETIC) 20-25 MG tablet Take 1 tablet by mouth daily. 06/19/17   [provider]  metroNIDAZOLE (FLAGYL) 500 MG tablet Take 1 tablet (500 mg total) by mouth 2 (two) times daily. 05/24/18   06/21/17, MD  pantoprazole (PROTONIX) 40 MG tablet TAKE 1 TABLET BY MOUTH ONCE DAILY (NEEDS FOLLOW UP FOR REFILLS OR HAVE PCP REFILL) 04/25/18   [provider]  rosuvastatin (CRESTOR) 10 MG tablet Take 10 mg by mouth daily. 03/26/20   [provider]  sertraline (ZOLOFT) 25 MG tablet Take 25 mg by mouth daily. 12/22/16 12/22/17  [provider]  spironolactone (ALDACTONE) 25 MG tablet Take 25 mg by mouth daily. 02/20/20   [provider]  Vitamin D, Ergocalciferol, (DRISDOL) 1.25 MG (50000 UT) CAPS capsule Take by mouth. 06/13/18   [provider]    Allergies Patient has no known allergies.  Family History  Problem Relation Age of Onset   Hypertension Mother    Stroke Mother    Hypertension Father  Stroke Maternal Grandmother     Social History Social History   Tobacco Use   Smoking status: Never   Smokeless tobacco: Never  Substance Use Topics   Alcohol use: No   Drug use: No    Review of Systems Constitutional: No fever/chills Eyes: No visual changes. ENT: No sore throat.  Loss of taste and smell. Cardiovascular: Denies chest pain. Respiratory: Denies shortness of breath.  Nonproductive cough. Gastrointestinal: GERD.  No abdominal pain.  No nausea, no vomiting.  No diarrhea.  No  constipation. Genitourinary: Negative for dysuria. Musculoskeletal: Negative for back pain. Skin: Negative for rash. Neurological: Negative for headaches, focal weakness or numbness. Endocrine: Hypertension   ____________________________________________   PHYSICAL EXAM:  VITAL SIGNS: ED Triage Vitals  Enc Vitals Group     BP 09/04/21 1703 101/77     Pulse Rate 09/04/21 1703 (!) 105     Resp 09/04/21 1703 20     Temp 09/04/21 1703 99.1 F (37.3 C)     Temp Source 09/04/21 1703 Oral     SpO2 09/04/21 1703 98 %     Weight 09/04/21 1701 260 lb (117.9 kg)     Height 09/04/21 1701 6' (1.829 m)     Head Circumference --      Peak Flow --      Pain Score 09/04/21 1701 0     Pain Loc --      Pain Edu? --      Excl. in [place]? --     Constitutional: Alert and oriented. Well appearing and in no acute distress. Eyes: Conjunctivae are normal. PERRL. EOMI. Head: Atraumatic. Nose: No congestion/rhinnorhea. Mouth/Throat: Mucous membranes are moist.  Oropharynx non-erythematous. Neck: No stridor.  No cervical spine tenderness to palpation. Hematological/Lymphatic/Immunilogical: No cervical lymphadenopathy. Cardiovascular: Normal rate, regular rhythm. Grossly normal heart sounds.  Good peripheral circulation. Respiratory: Normal respiratory effort.  No retractions. Lungs CTAB. Gastrointestinal: Soft and nontender. No distention. No abdominal bruits. No CVA tenderness. Genitourinary: Deferred Musculoskeletal: No lower extremity tenderness nor edema.  No joint effusions. Neurologic:  Normal speech and language. No gross focal neurologic deficits are appreciated. No gait instability. Skin:  Skin is warm, dry and intact. No rash noted. Psychiatric: Mood and affect are normal. Speech and behavior are normal.  ____________________________________________   LABS (all labs ordered are listed, but only abnormal results are displayed)  Labs Reviewed  RESP PANEL BY RT-PCR (FLU A&B, COVID) ARPGX2    ____________________________________________  EKG   ____________________________________________  RADIOLOGY I, Joni Reining, personally viewed and evaluated these images (plain radiographs) as part of my medical decision making, as well as reviewing the written report by the radiologist.  ED MD interpretation:    Official radiology report(s): No results found.  ____________________________________________   PROCEDURES  Procedure(s) performed (including Critical Care):  Procedures   ____________________________________________   INITIAL IMPRESSION / ASSESSMENT AND PLAN / ED COURSE  As part of my medical decision making, I reviewed the following data within the electronic MEDICAL RECORD NUMBER         Patient presents with 5 days of cough, congestion, loss of taste, loss of smell.  Patient complaining physical exam consistent with viral illness.  Patient advised the COVID-19 test result can be found later in the MyChart app.  Patient given prescription for Bromfed-DM and advised take extra strength Tylenol as needed for fever/pain.  Advised to follow-up with PCP or this department if condition worsens.      ____________________________________________   FINAL  CLINICAL IMPRESSION(S) / ED DIAGNOSES  Final diagnoses:  Viral URI with cough     ED Discharge Orders          Ordered    brompheniramine-pseudoephedrine-DM 30-2-10 MG/5ML syrup  4 times daily PRN        09/04/21 1933             Note:  This document was prepared using Dragon voice recognition software and may include unintentional dictation errors.    Joni Reining, PA-C 09/04/21 1942    Sharyn Creamer, MD 09/05/21 [phone]

## 2021-09-04 NOTE — ED Triage Notes (Signed)
Pt via POV from home. Pt's son is COVID +, since Monday night pt having cough congestion, loss of taste, and loss of smell. Pt is A&OX4 and NAD.

## 2021-09-04 NOTE — Discharge Instructions (Addendum)
Your COVID-19 test results will be found later in the MyChart app.  Read and follow discharge care instructions.  Take cough medicine as directed and use extra strength Tylenol for fever/pain.

## 2021-09-05 LAB — RESP PANEL BY RT-PCR (FLU A&B, COVID) ARPGX2
Influenza A by PCR: NEGATIVE
Influenza B by PCR: NEGATIVE
SARS Coronavirus 2 by RT PCR: POSITIVE — AB

## 2021-10-12 ENCOUNTER — Inpatient Hospital Stay
Admit: 2021-10-12 | Discharge: 2021-10-12 | Disposition: A | Discharge status: Home / Self Care | Payer: 59 | Attending: Internal Medicine | Admitting: Internal Medicine

## 2021-10-12 ENCOUNTER — Emergency Department: Payer: 59

## 2021-10-12 ENCOUNTER — Other Ambulatory Visit: Payer: Self-pay

## 2021-10-12 ENCOUNTER — Inpatient Hospital Stay
Admission: EM | Admit: 2021-10-12 | Discharge: 2021-10-14 | DRG: 684 | Disposition: A | Discharge status: Home / Self Care | Payer: 59 | Attending: Internal Medicine | Admitting: Internal Medicine

## 2021-10-12 ENCOUNTER — Encounter: Payer: Self-pay | Admitting: Emergency Medicine

## 2021-10-12 DIAGNOSIS — Z20822 Contact with and (suspected) exposure to covid-19: Secondary | ICD-10-CM | POA: Diagnosis present

## 2021-10-12 DIAGNOSIS — I872 Venous insufficiency (chronic) (peripheral): Secondary | ICD-10-CM | POA: Diagnosis present

## 2021-10-12 DIAGNOSIS — Z823 Family history of stroke: Secondary | ICD-10-CM | POA: Diagnosis not present

## 2021-10-12 DIAGNOSIS — Z6835 Body mass index (BMI) 35.0-35.9, adult: Secondary | ICD-10-CM

## 2021-10-12 DIAGNOSIS — I10 Essential (primary) hypertension: Secondary | ICD-10-CM | POA: Diagnosis present

## 2021-10-12 DIAGNOSIS — Z8249 Family history of ischemic heart disease and other diseases of the circulatory system: Secondary | ICD-10-CM

## 2021-10-12 DIAGNOSIS — Z8673 Personal history of transient ischemic attack (TIA), and cerebral infarction without residual deficits: Secondary | ICD-10-CM

## 2021-10-12 DIAGNOSIS — E1165 Type 2 diabetes mellitus with hyperglycemia: Secondary | ICD-10-CM | POA: Diagnosis present

## 2021-10-12 DIAGNOSIS — E669 Obesity, unspecified: Secondary | ICD-10-CM | POA: Diagnosis present

## 2021-10-12 DIAGNOSIS — N179 Acute kidney failure, unspecified: Principal | ICD-10-CM | POA: Diagnosis present

## 2021-10-12 LAB — CBC
HCT: 36.5 % — ABNORMAL LOW (ref 39.0–52.0)
Hemoglobin: 11.1 g/dL — ABNORMAL LOW (ref 13.0–17.0)
MCH: 21.9 pg — ABNORMAL LOW (ref 26.0–34.0)
MCHC: 30.4 g/dL (ref 30.0–36.0)
MCV: 71.9 fL — ABNORMAL LOW (ref 80.0–100.0)
Platelets: 313 10*3/uL (ref 150–400)
RBC: 5.08 MIL/uL (ref 4.22–5.81)
RDW: 14.6 % (ref 11.5–15.5)
WBC: 7.4 10*3/uL (ref 4.0–10.5)
nRBC: 0 % (ref 0.0–0.2)

## 2021-10-12 LAB — URINALYSIS, ROUTINE W REFLEX MICROSCOPIC
Bacteria, UA: NONE SEEN
Bilirubin Urine: NEGATIVE
Glucose, UA: 150 mg/dL — AB
Ketones, ur: NEGATIVE mg/dL
Leukocytes,Ua: NEGATIVE
Nitrite: NEGATIVE
Protein, ur: 30 mg/dL — AB
Specific Gravity, Urine: 1.016 (ref 1.005–1.030)
pH: 5 (ref 5.0–8.0)

## 2021-10-12 LAB — COMPREHENSIVE METABOLIC PANEL
ALT: 30 U/L (ref 0–44)
AST: 16 U/L (ref 15–41)
Albumin: 4.7 g/dL (ref 3.5–5.0)
Alkaline Phosphatase: 36 U/L — ABNORMAL LOW (ref 38–126)
Anion gap: 8 (ref 5–15)
BUN: 61 mg/dL — ABNORMAL HIGH (ref 6–20)
CO2: 28 mmol/L (ref 22–32)
Calcium: 9.4 mg/dL (ref 8.9–10.3)
Chloride: 103 mmol/L (ref 98–111)
Creatinine, Ser: 3.15 mg/dL — ABNORMAL HIGH (ref 0.61–1.24)
GFR, Estimated: 24 mL/min — ABNORMAL LOW (ref >60)
Glucose, Bld: 106 mg/dL — ABNORMAL HIGH (ref 70–99)
Potassium: 4.1 mmol/L (ref 3.5–5.1)
Sodium: 139 mmol/L (ref 135–145)
Total Bilirubin: 0.7 mg/dL (ref 0.3–1.2)
Total Protein: 8.3 g/dL — ABNORMAL HIGH (ref 6.5–8.1)

## 2021-10-12 LAB — RESP PANEL BY RT-PCR (FLU A&B, COVID) ARPGX2
Influenza A by PCR: NEGATIVE
Influenza B by PCR: NEGATIVE
SARS Coronavirus 2 by RT PCR: NEGATIVE

## 2021-10-12 LAB — LIPASE, BLOOD: Lipase: 64 U/L — ABNORMAL HIGH (ref 11–51)

## 2021-10-12 LAB — CK: Total CK: 200 U/L (ref 49–397)

## 2021-10-12 MED ORDER — PANTOPRAZOLE SODIUM 40 MG PO TBEC
40.0000 mg | DELAYED_RELEASE_TABLET | Freq: Every day | ORAL | Status: DC
  Administered 2021-10-13 – 2021-10-14 (×2): 40 mg via ORAL
  Filled 2021-10-12 (×2): qty 1

## 2021-10-12 MED ORDER — ENOXAPARIN SODIUM 40 MG/0.4ML IJ SOSY: 40.0000 mg | PREFILLED_SYRINGE | INTRAMUSCULAR | Status: DC

## 2021-10-12 MED ORDER — VITAMIN D (ERGOCALCIFEROL) 1.25 MG (50000 UNIT) PO CAPS: 50000.0000 [IU] | ORAL_CAPSULE | ORAL | Status: DC

## 2021-10-12 MED ORDER — ENOXAPARIN SODIUM 60 MG/0.6ML IJ SOSY
0.5000 mg/kg | PREFILLED_SYRINGE | INTRAMUSCULAR | Status: DC
  Filled 2021-10-12: qty 0.6

## 2021-10-12 MED ORDER — SODIUM CHLORIDE 0.9 % IV BOLUS
1000.0000 mL | Freq: Once | INTRAVENOUS | Status: AC
  Administered 2021-10-12: 1000 mL via INTRAVENOUS

## 2021-10-12 MED ORDER — ONDANSETRON HCL 4 MG/2ML IJ SOLN: 4.0000 mg | Freq: Four times a day (QID) | INTRAMUSCULAR | Status: DC | PRN

## 2021-10-12 MED ORDER — CARVEDILOL PHOSPHATE ER 10 MG PO CP24
20.0000 mg | ORAL_CAPSULE | Freq: Every day | ORAL | Status: DC
Start: 2021-10-12 — End: 2021-10-12

## 2021-10-12 MED ORDER — ROSUVASTATIN CALCIUM 10 MG PO TABS
10.0000 mg | ORAL_TABLET | Freq: Every day | ORAL | Status: DC
  Administered 2021-10-13 – 2021-10-14 (×2): 10 mg via ORAL
  Filled 2021-10-12 (×2): qty 1

## 2021-10-12 MED ORDER — ACETAMINOPHEN 650 MG RE SUPP: 650.0000 mg | Freq: Four times a day (QID) | RECTAL | Status: DC | PRN

## 2021-10-12 MED ORDER — ACETAMINOPHEN 325 MG PO TABS: 650.0000 mg | ORAL_TABLET | Freq: Four times a day (QID) | ORAL | Status: DC | PRN

## 2021-10-12 MED ORDER — SODIUM CHLORIDE 0.9 % IV SOLN: INTRAVENOUS | Status: DC

## 2021-10-12 MED ORDER — ONDANSETRON HCL 4 MG PO TABS: 4.0000 mg | ORAL_TABLET | Freq: Four times a day (QID) | ORAL | Status: DC | PRN

## 2021-10-12 NOTE — ED Provider Notes (Signed)
[facility] Provider Note    Event Date/Time   First MD Initiated Contact with Patient 10/12/21 1139     (approximate)  History   Chief Complaint: Abnormal Lab  HPI  Jesus Travis is a 46 y.o. male with a past medical history of hypertension, presents to the emergency department for abnormal blood work.  According to the patient he states he saw his PCP yesterday because he has been experiencing some intermittent left lower quadrant pain over the past several weeks.  States he got a call back today saying that his kidney function was diminished and he needed to come to the emergency department for evaluation.  Patient states the left lower quadrant pain has been mild and intermittent.  Patient states he has been urinating more often than normal but states he has been drinking a lot of fluids as well.  Patient was told that his A1c was elevated.  Physical Exam   Triage Vital Signs: ED Triage Vitals  Enc Vitals Group     BP 10/12/21 1115 107/63     Pulse Rate 10/12/21 1115 81     Resp 10/12/21 1115 18     Temp 10/12/21 1115 97.6 F (36.4 C)     Temp Source 10/12/21 1115 Oral     SpO2 10/12/21 1115 100 %     Weight 10/12/21 1108 259 lb 14.8 oz (117.9 kg)     Height 10/12/21 1108 6' (1.829 m)     Head Circumference --      Peak Flow --      Pain Score 10/12/21 1108 4     Pain Loc --      Pain Edu? --      Excl. in [place]? --     Most recent vital signs: Vitals:   10/12/21 1115  BP: 107/63  Pulse: 81  Resp: 18  Temp: 97.6 F (36.4 C)  SpO2: 100%    General: Awake, no distress.  CV:  Good peripheral perfusion.  Regular rate and rhythm  Resp:  Normal effort.  Equal breath sounds bilaterally.  Abd:  No distention.  Soft, slight left lower quadrant tenderness palpation without rebound guarding or distention Other:  No lower extremity edema.   ED Results / Procedures / Treatments   RADIOLOGY  I have personally reviewed the CT images.  There  is no sign of kidney stones or bladder obstruction on my evaluation. Radiology is read the CT is largely negative.   MEDICATIONS ORDERED IN ED: Medications - No data to display   IMPRESSION / MDM / [place] / ED COURSE  I reviewed the triage vital signs and the nursing notes.  Patient presents to the emergency department after being called back by his physician for abnormal lab work.  I have reviewed the patient's note from yesterday by his PCP Dr. Kary Kos.  At that time patient was complaining of left lower quadrant pain, had lab work performed and was called back today regarding the results.  I reviewed the results of the lab work.  The main finding is patient's creatinine went from 1.2 in November to now 3.3.  Patient's A1c is also elevated to 7.9 indicating likely diabetes.  Urinalysis yesterday did show greater than 1000 glucose reassuringly no protein.  Patient presents emergency department for worsening renal function as well as an elevated A1c.  Patient is complaining of some left lower quadrant pain as well for the past 2 weeks.  We will  obtain a CT scan to rule out diverticulitis ureterolithiasis, would also need to evaluate for bladder obstruction which could account for renal dysfunction.  We will recheck labs to ensure accuracy.  Patient's labs have resulted showing a creatinine of 3.15.  This is increased from 0.8 last year.  Given the patient's acute renal insufficiency we will begin IV hydration.  Remainder the chemistry is largely nonrevealing glucose of 106.  Lipase is slightly elevated at 64 but normal LFTs.  CBC is normal, mild anemia which is chronic.  We will obtain a urine sample as well as CT imaging.  We will continue to closely monitor.  Given the patient's acute renal insufficiency I anticipate likely admission to the hospital for further work-up and treatment.  Patient CT shows no concerning abnormality.  COVID/flu is negative.  Given the patient's acute kidney  injury will admit to the hospital service for ongoing work-up and treatment.  Patient agreeable to plan of care.  FINAL CLINICAL IMPRESSION(S) / ED DIAGNOSES   Acute renal insufficiency  Note:  This document was prepared using Dragon voice recognition software and may include unintentional dictation errors.   Harvest Dark, MD 10/12/21 1324

## 2021-10-12 NOTE — H&P (Signed)
History and Physical    Patient: Jesus Travis DOB: [birth year] DOA: 10/12/2021 DOS: the patient was seen and examined on 10/12/2021 PCP: Jerl Mina, MD  Patient coming from: Home  Chief Complaint:  Chief Complaint  Patient presents with   Abnormal Lab    HPI: Jesus Travis is a 46 y.o. 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 Patient states that Jesus Travis has had left lower quadrant pain for over a week associated with diarrhea but denies having any fever, no chills, no nausea or vomiting. Jesus Travis went to see his primary care provider due to his persistent left lower quadrant pain and was placed empirically on Augmentin for possible diverticular disease. Patient states that Jesus Travis was called and asked to go to the ER because his labs were abnormal. Labs showed a serum creatinine of 3.31 compared to baseline of 1.25 from a year ago.  Jesus Travis also had a hemoglobin A1c of 7.9 and does not have a known history of diabetes. Jesus Travis complains of paresthesias in his hands which is new. Jesus Travis denies having any anorexia, no chest pain, no shortness of breath, no dizziness, no lightheadedness, no blurred vision, no focal deficit, no headache, Jesus Travis denies having any weight loss, denies frequency of urination or increased thirst. Jesus Travis denies NSAID use but has a family history of chronic kidney disease   Review of Systems: As mentioned in the history of present illness. All other systems reviewed and are negative. Past Medical History:  Diagnosis Date   CVA (cerebral infarction) 2011   mild    Hypertension    Past Surgical History:  Procedure Laterality Date   WISDOM TOOTH EXTRACTION     Social History:  reports that Jesus Travis has never smoked. Jesus Travis has never used smokeless tobacco. Jesus Travis reports that Jesus Travis does not drink alcohol and does  not use drugs.  No Known Allergies  Family History  Problem Relation Age of Onset   Hypertension Mother    Stroke Mother    Hypertension Father    Stroke Maternal Grandmother     Prior to Admission medications   Medication Sig Start Date End Date Taking? Authorizing Provider  carvedilol (COREG CR) 20 MG 24 hr capsule Take 20 mg by mouth daily. 05/23/20  Yes [provider]  hydrochlorothiazide (HYDRODIURIL) 25 MG tablet Take 25 mg by mouth daily.   Yes [provider]  lisinopril (ZESTRIL) 40 MG tablet Take 40 mg by mouth daily.   Yes [provider]  pantoprazole (PROTONIX) 40 MG tablet Take 40 mg by mouth daily.   Yes [provider]  rosuvastatin (CRESTOR) 10 MG tablet Take 10 mg by mouth daily.   Yes [provider]  spironolactone (ALDACTONE) 25 MG tablet Take 25 mg by mouth daily. 02/20/20  Yes [provider]  Vitamin D, Ergocalciferol, (DRISDOL) 1.25 MG (50000 UT) CAPS capsule Take 50,000 Units by mouth every Friday.   Yes [provider]  amoxicillin-clavulanate (AUGMENTIN) 875-125 MG tablet Take 1 tablet by mouth 2 (two) times daily. 10/11/21 10/18/21  [provider]    Physical Exam: Vitals:   10/12/21 1108 10/12/21 1115  BP:  107/63  Pulse:  81  Resp:  18  Temp:  97.6 F (36.4 C)  TempSrc:  Oral  SpO2:  100%  Weight: 117.9 kg   Height:  6' (1.829 m)    Physical Exam Vitals and nursing note reviewed.  Constitutional:      Appearance: Jesus Travis is obese.  HENT:     Head: Normocephalic and atraumatic.     Nose: Nose normal.     Mouth/Throat:     Mouth: Mucous membranes are moist.  Eyes:     Pupils: Pupils are equal, round, and reactive to light.     Comments: Pale conjunctiva  Cardiovascular:     Rate and Rhythm: Normal rate and regular rhythm.  Pulmonary:     Effort: Pulmonary effort is normal.     Breath sounds: Normal breath sounds.  Abdominal:     General: Abdomen is flat.     Palpations:  Abdomen is soft.     Tenderness: There is abdominal tenderness.     Comments: LLQ tenderness  Musculoskeletal:        General: Normal range of motion.     Cervical back: Normal range of motion and neck supple.  Skin:    General: Skin is warm and dry.  Neurological:     General: No focal deficit present.     Mental Status: Jesus Travis is alert and oriented to person, place, and time.  Psychiatric:        Mood and Affect: Mood normal.        Behavior: Behavior normal.     Data Reviewed: Notes from primary care and specialist visits, past discharge summaries. Prior diagnostic testing as applicable to current admission diagnoses Updated medications and problem lists for reconciliation ED course, including vitals, labs, imaging, treatment and response to treatment Triage notes and ED providers notes There are no new results to review at this time.  Assessment and Plan: Principal Problem:   AKI (acute kidney injury) (HCC) Active Problems:   Chronic venous insufficiency   Essential hypertension   Obesity (BMI 30-39.9)    AKI Probably medication induced Patient has a baseline serum creatinine of 0.86 from one year ago but today on admission it is 3.15 We will hold lisinopril, hydrochlorothiazide and spironolactone Judicious IV fluid hydration Repeat renal parameters in a.m. We will request nephrology consult if no improvement in renal function    Essential hypertension Patient is normotensive Review of medications shows Jesus Travis is on multiple antihypertensive medications which include hydrochlorothiazide 25 mg, lisinopril 40 mg, spironolactone 25 mg and Coreg CR 20 mg. Hold all antihypertensive medications for now   Obesity (BMI 35) Complicates overall prognosis and care Lifestyle modification and exercise has been discussed with patient     Elevated hemoglobin A1c level Patient noted to have a hemoglobin A1c level of 7.9 at his PCP's office No known history of diabetes  mellitus We will check blood sugars with meals    Advance Care Planning:   Code Status: Full Code   Consults: None  Family Communication: Greater than 50% of time was spent discussing patient's condition and plan of care with him at the bedside.  All questions and concerns have been addressed.  Jesus Travis verbalizes understanding and agrees with the plan.  Severity of Illness: The appropriate patient status for this patient is INPATIENT. Inpatient status is judged to be reasonable and necessary in order to provide the required intensity of service to ensure the patient's safety. The patient's presenting symptoms, physical exam findings, and initial radiographic and laboratory data in the context of their chronic comorbidities is felt to place them at high risk for further clinical deterioration. Furthermore, it is not anticipated that  the patient will be medically stable for discharge from the hospital within 2 midnights of admission.   * I certify that at the point of admission it is my clinical judgment that the patient will require inpatient hospital care spanning beyond 2 midnights from the point of admission due to high intensity of service, high risk for further deterioration and high frequency of surveillance required.*  Author: Lucile Shutters, MD 10/12/2021 3:07 PM  For on call review [email or web address].

## 2021-10-12 NOTE — ED Notes (Signed)
See triage note. Pt states bilateral hand paresthesia is intermittent and changes based on what side he is laying on. Pt indicates abdominal pain is in LLQ and he can sometimes feel a "lump" where the pain is located. Pt denies Hx of hernia. Pt also sent to ED by primary care for abnormal A1C result, Pt unsure of result value.

## 2021-10-12 NOTE — Progress Notes (Signed)
Patient states that he has a cyst on right wrist.

## 2021-10-12 NOTE — ED Notes (Addendum)
Nurse Fonda Kinder) signed off on report. Pt ready to go up.

## 2021-10-12 NOTE — ED Triage Notes (Signed)
Pt comes into the ED via POV c/o abnormal labs with elevated A1C and kidney problems.  Pt has had new paraesthesia in his hands.  Pt also c/o abd pain.  Pt in NAD at this time with even and unlabored respirations.

## 2021-10-13 LAB — CBC
HCT: 33 % — ABNORMAL LOW (ref 39.0–52.0)
Hemoglobin: 10.1 g/dL — ABNORMAL LOW (ref 13.0–17.0)
MCH: 22 pg — ABNORMAL LOW (ref 26.0–34.0)
MCHC: 30.6 g/dL (ref 30.0–36.0)
MCV: 71.9 fL — ABNORMAL LOW (ref 80.0–100.0)
Platelets: 278 10*3/uL (ref 150–400)
RBC: 4.59 MIL/uL (ref 4.22–5.81)
RDW: 14.6 % (ref 11.5–15.5)
WBC: 7 10*3/uL (ref 4.0–10.5)
nRBC: 0 % (ref 0.0–0.2)

## 2021-10-13 LAB — GLUCOSE, CAPILLARY
Glucose-Capillary: 120 mg/dL — ABNORMAL HIGH (ref 70–99)
Glucose-Capillary: 129 mg/dL — ABNORMAL HIGH (ref 70–99)
Glucose-Capillary: 94 mg/dL (ref 70–99)

## 2021-10-13 LAB — HEMOGLOBIN A1C
Hgb A1c MFr Bld: 7.4 % — ABNORMAL HIGH (ref 4.8–5.6)
Mean Plasma Glucose: 165.68 mg/dL

## 2021-10-13 LAB — BASIC METABOLIC PANEL
Anion gap: 7 (ref 5–15)
BUN: 46 mg/dL — ABNORMAL HIGH (ref 6–20)
CO2: 26 mmol/L (ref 22–32)
Calcium: 9.6 mg/dL (ref 8.9–10.3)
Chloride: 105 mmol/L (ref 98–111)
Creatinine, Ser: 2.55 mg/dL — ABNORMAL HIGH (ref 0.61–1.24)
GFR, Estimated: 31 mL/min — ABNORMAL LOW (ref >60)
Glucose, Bld: 144 mg/dL — ABNORMAL HIGH (ref 70–99)
Potassium: 3.9 mmol/L (ref 3.5–5.1)
Sodium: 138 mmol/L (ref 135–145)

## 2021-10-13 LAB — HIV ANTIBODY (ROUTINE TESTING W REFLEX): HIV Screen 4th Generation wRfx: NONREACTIVE

## 2021-10-13 MED ORDER — HYDROCORTISONE 1 % EX CREA
TOPICAL_CREAM | Freq: Two times a day (BID) | CUTANEOUS | Status: DC
  Filled 2021-10-13: qty 28

## 2021-10-13 NOTE — Progress Notes (Signed)
PROGRESS NOTE    Jesus Travis  PXT:[id number] DOB: 1976-07-24 DOA: 10/12/2021 PCP: Jerl Mina, MD    Brief Narrative:  46 y.o. 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 Patient states that he has had left lower quadrant pain for over a week associated with diarrhea but denies having any fever, no chills, no nausea or vomiting. He went to see his primary care provider due to his persistent left lower quadrant pain and was placed empirically on Augmentin for possible diverticular disease. Patient states that he was called and asked to go to the ER because his labs were abnormal. Labs showed a serum creatinine of 3.31 compared to baseline of 1.25 from a year ago.  He also had a hemoglobin A1c of 7.4 and does not have a known history of diabetes.   Assessment & Plan:   Principal Problem:   AKI (acute kidney injury) (HCC) Active Problems:   Chronic venous insufficiency   Essential hypertension   Obesity (BMI 30-39.9)  AKI Probably medication induced Baseline creatinine of 1.861-year ago 3.15 creatinine on admission Subsequently downtrending on IV fluids Plan: Continue IVF Continue holding lisinopril, hydrochlorothiazide, Aldactone Consider discontinuation of these agents on discharge Repeat renal indices in a.m.   Essential hypertension Patient is normotensive Review of medications shows he is on multiple antihypertensive medications which include hydrochlorothiazide 25 mg, lisinopril 40 mg, spironolactone 25 mg and Coreg CR 20 mg. Suspect his hypertension may have been overtreated Plan: Hold all antihypertensive medications for now   Obesity (BMI 35) Complicates overall prognosis and care Lifestyle modification and exercise has been discussed with patient   Elevated hemoglobin A1c  level Patient noted to have a hemoglobin A1c level of 7.9 at his PCP's office Repeat A1c 7.4 No known history of diabetes mellitus We will check blood sugars with meals Blood sugars have been reasonably well controlled Can consider oral antidiabetic agents at time of discharge Will need outpatient PCP follow-up     DVT prophylaxis: SQ Lovenox Code Status: Full Family Communication:Spouse Neville Route [phone] Disposition Plan: Status is: Inpatient Remains inpatient appropriate because: Renal failure, improving on IVF.  Anticipate DC 2/9    Level of care: Telemetry Medical  Consultants:  None  Procedures:  None  Antimicrobials: None   Subjective: Patient seen and examined.  No acute distress.  No pain complaints  Objective: Vitals:   10/13/21 0433 10/13/21 0436 10/13/21 0804 10/13/21 1543  BP: (!) 92/56 109/64 108/70 101/65  Pulse: 78 80 77 91  Resp: 16  16 16   Temp: 97.9 F (36.6 C)  98.3 F (36.8 C) 98.5 F (36.9 C)  TempSrc:      SpO2: 100% 100% 100% 97%  Weight:      Height:        Intake/Output Summary (Last 24 hours) at 10/13/2021 1603 Last data filed at 10/13/2021 1053 Gross per 24 hour  Intake 2311.91 ml  Output 1 ml  Net 2310.91 ml   Filed Weights   10/12/21 1108  Weight: 117.9 kg    Examination:  General exam: Appears calm and comfortable  Respiratory system: Clear to auscultation. Respiratory effort normal. Cardiovascular system: S1-S2, RRR, no murmurs, no pedal edema Gastrointestinal system: Soft, NT/ND, normal bowel sounds Central nervous system: Alert and oriented. No focal neurological deficits. Extremities: Symmetric 5 x 5 power. Skin: No rashes,  lesions or ulcers Psychiatry: Judgement and insight appear normal. Mood & affect appropriate.     Data Reviewed: I have personally reviewed following labs and imaging studies  CBC: Recent Labs  Lab 10/12/21 1117 10/13/21 0440  WBC 7.4 7.0  HGB 11.1* 10.1*  HCT 36.5* 33.0*  MCV  71.9* 71.9*  PLT 313 278   Basic Metabolic Panel: Recent Labs  Lab 10/12/21 1117 10/13/21 0440  NA 139 138  K 4.1 3.9  CL 103 105  CO2 28 26  GLUCOSE 106* 144*  BUN 61* 46*  CREATININE 3.15* 2.55*  CALCIUM 9.4 9.6   GFR: Estimated Creatinine Clearance: 49.5 mL/min (A) (by C-G formula based on SCr of 2.55 mg/dL (H)). Liver Function Tests: Recent Labs  Lab 10/12/21 1117  AST 16  ALT 30  ALKPHOS 36*  BILITOT 0.7  PROT 8.3*  ALBUMIN 4.7   Recent Labs  Lab 10/12/21 1117  LIPASE 64*   No results for input(s): AMMONIA in the last 168 hours. Coagulation Profile: No results for input(s): INR, PROTIME in the last 168 hours. Cardiac Enzymes: Recent Labs  Lab 10/12/21 1117  CKTOTAL 200   BNP (last 3 results) No results for input(s): PROBNP in the last 8760 hours. HbA1C: Recent Labs    10/13/21 0440  HGBA1C 7.4*   CBG: Recent Labs  Lab 10/13/21 0805 10/13/21 1116  GLUCAP 120* 129*   Lipid Profile: No results for input(s): CHOL, HDL, LDLCALC, TRIG, CHOLHDL, LDLDIRECT in the last 72 hours. Thyroid Function Tests: No results for input(s): TSH, T4TOTAL, FREET4, T3FREE, THYROIDAB in the last 72 hours. Anemia Panel: No results for input(s): VITAMINB12, FOLATE, FERRITIN, TIBC, IRON, RETICCTPCT in the last 72 hours. Sepsis Labs: No results for input(s): PROCALCITON, LATICACIDVEN in the last 168 hours.  Recent Results (from the past 240 hour(s))  Resp Panel by RT-PCR (Flu A&B, Covid) Nasopharyngeal Swab     Status: None   Collection Time: 10/12/21 12:17 PM   Specimen: Nasopharyngeal Swab; Nasopharyngeal(NP) swabs in vial transport medium  Result Value Ref Range Status   SARS Coronavirus 2 by RT PCR NEGATIVE NEGATIVE Final    Comment: (NOTE) SARS-CoV-2 target nucleic acids are NOT DETECTED.  The SARS-CoV-2 RNA is generally detectable in upper respiratory specimens during the acute phase of infection. The lowest concentration of SARS-CoV-2 viral copies this  assay can detect is 138 copies/mL. A negative result does not preclude SARS-Cov-2 infection and should not be used as the sole basis for treatment or other patient management decisions. A negative result may occur with  improper specimen collection/handling, submission of specimen other than nasopharyngeal swab, presence of viral mutation(s) within the areas targeted by this assay, and inadequate number of viral copies(<138 copies/mL). A negative result must be combined with clinical observations, patient history, and epidemiological information. The expected result is Negative.  Fact Sheet for Patients:  BloggerCourse.com  Fact Sheet for Healthcare Providers:  SeriousBroker.it  This test is no t yet approved or cleared by the [place] FDA and  has been authorized for detection and/or diagnosis of SARS-CoV-2 by FDA under an Emergency Use Authorization (EUA). This EUA will remain  in effect (meaning this test can be used) for the duration of the COVID-19 declaration under Section 564(b)(1) of the Act, 21 U.S.C.section 360bbb-3(b)(1), unless the authorization is terminated  or revoked sooner.       Influenza A by PCR NEGATIVE NEGATIVE Final   Influenza B by PCR NEGATIVE NEGATIVE Final    Comment: (NOTE)  The Xpert Xpress SARS-CoV-2/FLU/RSV plus assay is intended as an aid in the diagnosis of influenza from Nasopharyngeal swab specimens and should not be used as a sole basis for treatment. Nasal washings and aspirates are unacceptable for Xpert Xpress SARS-CoV-2/FLU/RSV testing.  Fact Sheet for Patients: BloggerCourse.com  Fact Sheet for Healthcare Providers: SeriousBroker.it  This test is not yet approved or cleared by the [place] FDA and has been authorized for detection and/or diagnosis of SARS-CoV-2 by FDA under an Emergency Use Authorization (EUA). This EUA will  remain in effect (meaning this test can be used) for the duration of the COVID-19 declaration under Section 564(b)(1) of the Act, 21 U.S.C. section 360bbb-3(b)(1), unless the authorization is terminated or revoked.  Performed at [facility], [address], [place], [place] [address]          Radiology Studies: CT Renal Stone Study  Result Date: 10/12/2021 CLINICAL DATA:  Acute left flank pain. EXAM: CT ABDOMEN AND PELVIS WITHOUT CONTRAST TECHNIQUE: Multidetector CT imaging of the abdomen and pelvis was performed following the standard protocol without IV contrast. RADIATION DOSE REDUCTION: This exam was performed according to the departmental dose-optimization program which includes automated exposure control, adjustment of the mA and/or kV according to patient size and/or use of iterative reconstruction technique. COMPARISON:  September 29, 2010. FINDINGS: Lower chest: No acute abnormality. Hepatobiliary: No gallstones or biliary dilatation is noted. Hepatic steatosis is noted. Pancreas: Unremarkable. No pancreatic ductal dilatation or surrounding inflammatory changes. Spleen: Normal in size without focal abnormality. Adrenals/Urinary Tract: Adrenal glands are unremarkable. Kidneys are normal, without renal calculi, focal lesion, or hydronephrosis. Bladder is unremarkable. Stomach/Bowel: Stomach is within normal limits. Appendix appears normal. No evidence of bowel wall thickening, distention, or inflammatory changes. Vascular/Lymphatic: No significant vascular findings are present. No enlarged abdominal or pelvic lymph nodes. Reproductive: Prostate is unremarkable. Other: No abdominal wall hernia or abnormality. No abdominopelvic ascites. Musculoskeletal: No acute or significant osseous findings. IMPRESSION: Hepatic steatosis. No other abnormality seen in the abdomen or pelvis. Electronically Signed   By: Lupita Raider M.D.   On: 10/12/2021 12:39        Scheduled Meds:   enoxaparin (LOVENOX) injection  0.5 mg/kg Subcutaneous Q24H   hydrocortisone cream   Topical BID   pantoprazole  40 mg Oral Daily   rosuvastatin  10 mg Oral Daily   [START ON 10/15/2021] Vitamin D (Ergocalciferol)  50,000 Units Oral Q Fri   Continuous Infusions:  sodium chloride 100 mL/hr at 10/13/21 1053     LOS: 1 day       Tresa Moore, MD [facility]   If 7PM-7AM, please contact night-coverage  10/13/2021, 4:03 PM

## 2021-10-13 NOTE — Progress Notes (Signed)
Chaplain On-Call responded to [place] Order for prayer with the patient.  Chaplain met the patient and learned about his faith journey, and his gratitude for the medical team's diagnosis of high blood sugar and a new treatment plan.  Chaplain provided spiritual and emotional support and prayer.  Chaplain Charlie Sheina Mcleish M.Div., [facility]

## 2021-10-14 LAB — ECHOCARDIOGRAM COMPLETE
Area-P 1/2: 4.21 cm2
Height: 72 in
S' Lateral: 2.7 cm
Weight: 4158.76 oz

## 2021-10-14 LAB — BASIC METABOLIC PANEL
Anion gap: 8 (ref 5–15)
BUN: 36 mg/dL — ABNORMAL HIGH (ref 6–20)
CO2: 23 mmol/L (ref 22–32)
Calcium: 9.5 mg/dL (ref 8.9–10.3)
Chloride: 105 mmol/L (ref 98–111)
Creatinine, Ser: 1.65 mg/dL — ABNORMAL HIGH (ref 0.61–1.24)
GFR, Estimated: 53 mL/min — ABNORMAL LOW (ref >60)
Glucose, Bld: 133 mg/dL — ABNORMAL HIGH (ref 70–99)
Potassium: 4.1 mmol/L (ref 3.5–5.1)
Sodium: 136 mmol/L (ref 135–145)

## 2021-10-14 LAB — GLUCOSE, CAPILLARY
Glucose-Capillary: 117 mg/dL — ABNORMAL HIGH (ref 70–99)
Glucose-Capillary: 92 mg/dL (ref 70–99)

## 2021-10-14 NOTE — Discharge Summary (Signed)
Physician Discharge Summary  Jesus Travis BOF:[id number] DOB: 1976-05-15 DOA: 10/12/2021  PCP: Jerl Mina, MD  Admit date: 10/12/2021 Discharge date: 10/14/2021  Admitted From: Home Disposition: Home  Recommendations for Outpatient Follow-up:  Follow up with PCP in 1-2 weeks   Home Health: No Equipment/Devices: None  Discharge Condition: Stable CODE STATUS: Full Diet recommendation: Regular  Brief/Interim Summary:  46 y.o. 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 Patient states that he has had left lower quadrant pain for over a week associated with diarrhea but denies having any fever, no chills, no nausea or vomiting. He went to see his primary care provider due to his persistent left lower quadrant pain and was placed empirically on Augmentin for possible diverticular disease. Patient states that he was called and asked to go to the ER because his labs were abnormal. Labs showed a serum creatinine of 3.31 compared to baseline of 1.25 from a year ago.  He also had a hemoglobin A1c of 7.4 and does not have a known history of diabetes.    Kidney function improved at time of discharge.  Creatinine not quite at baseline but is showing good signs of improvement.  Will discharge home at this time.  Educated to discontinue lisinopril, Aldactone, hydrochlorothiazide at time of discharge.  Okay to resume Coreg.  Blood pressures remain stable and controlled well on Coreg monotherapy while admitted.  Also discussed meaning of hemoglobin A1c of 7.4.  Educated on dietary modification.  Patient will need follow-up with primary care within 1 to 2 weeks of discharge to repeat lab work, discuss antihypertensive strategy, discussed diet versus medication for likely new onset diabetes.   Discharge Diagnoses:   Principal Problem:   AKI (acute kidney injury) (HCC) Active Problems:   Chronic venous insufficiency   Essential hypertension   Obesity (BMI 30-39.9)  AKI Probably medication induced 3.15 creatinine on admission, normal 1 year prior Subsequently downtrending on IV fluids Plan: Creatinine 1.6 at time of discharge.  Okay for DC at this time.  Discontinue IV fluids.  At time of discharge will recommend patient discontinue lisinopril, Aldactone, hydrochlorothiazide.  Okay to resume Coreg.  Follow-up outpatient PCP 1 to 2 weeks for repeat labs   Essential hypertension Patient is normotensive Review of medications shows he is on multiple antihypertensive medications which include hydrochlorothiazide 25 mg, lisinopril 40 mg, spironolactone 25 mg and Coreg CR 20 mg. Suspect his hypertension may have been overtreated Plan: Coreg to be resumed.  Aldactone, lisinopril, hydrochlorothiazide held.  Follow-up outpatient PCP 1 to 2 weeks   Obesity (BMI 35) Complicates overall prognosis and care Lifestyle modification and exercise has been discussed with patient   Elevated hemoglobin A1c level Patient noted to have a hemoglobin A1c level of 7.9 at his PCP's office Repeat A1c 7.4 No known history of diabetes mellitus Discussed that hemoglobin A1c of 7.  For likely represents new diagnosis diabetes.  Educated on meaning of diabetes, insulin resistance, dietary modification strategies.  Discussed that patient will not be started on antidiabetic medication at this time however strongly encouraged to make needed dietary changes and follow-up with PCP.  At the minimum will need repeat hemoglobin A1c in 3 months and further discussion regarding medication versus dietary strategy  Discharge Instructions  Discharge Instructions     Diet - low sodium heart healthy  Complete by: As directed    Increase activity slowly   Complete by: As directed       Allergies as of 10/14/2021       Reactions    Pork-derived Products Other (See Comments)   "Pt states he feels bad"        Medication List     STOP taking these medications    amoxicillin-clavulanate 875-125 MG tablet Commonly known as: AUGMENTIN   hydrochlorothiazide 25 MG tablet Commonly known as: HYDRODIURIL   lisinopril 40 MG tablet Commonly known as: ZESTRIL   spironolactone 25 MG tablet Commonly known as: ALDACTONE       TAKE these medications    carvedilol 20 MG 24 hr capsule Commonly known as: COREG CR Take 20 mg by mouth daily.   pantoprazole 40 MG tablet Commonly known as: PROTONIX Take 40 mg by mouth daily.   rosuvastatin 10 MG tablet Commonly known as: CRESTOR Take 10 mg by mouth daily.   Vitamin D (Ergocalciferol) 1.25 MG (50000 UNIT) Caps capsule Commonly known as: DRISDOL Take 50,000 Units by mouth every Friday.        Follow-up Information     Jerl Mina, MD. Schedule an appointment as soon as possible for a visit in 1 week(s).   Specialty: Family Medicine Contact information: [address] [place] [address] [phone]                Allergies  Allergen Reactions   Pork-Derived Products Other (See Comments)    "Pt states he feels bad"    Consultations: None   Procedures/Studies: ECHOCARDIOGRAM COMPLETE  Result Date: 10/14/2021    ECHOCARDIOGRAM REPORT   Patient Name:   Jesus Travis Date of Exam: 10/12/2021 Medical Rec #:  [id number]         Height:       72.0 in Accession #:    [id number]        Weight:       259.9 lb Date of Birth:  12-17-1975         BSA:          2.382 m Patient Age:    45 years          BP:           102/67 mmHg Patient Gender: M                 HR:           88 bpm. Exam Location:  [facility] Procedure: 2D Echo, Cardiac Doppler and Color Doppler Indications:     I42.9 Cardiomyopathy  History:         Patient has no prior history of Echocardiogram examinations.                  CVA; Risk Factors:Hypertension.  Sonographer:      Daphine Deutscher RDCS Referring Phys:  [id number] AGBATA Diagnosing Phys: Adrian Blackwater IMPRESSIONS  1. Left ventricular ejection fraction, by estimation, is 60 to 65%. The left ventricle has normal function. The left ventricle has no regional wall motion abnormalities. There is moderate left ventricular hypertrophy. Left ventricular diastolic parameters were normal.  2. Right ventricular systolic function is normal. The right ventricular size is normal.  3. The mitral valve is normal in structure. Trivial mitral valve regurgitation. No evidence of mitral stenosis.  4. The aortic valve is normal in structure. Aortic valve regurgitation is not visualized. No aortic stenosis is  present.  5. The inferior vena cava is normal in size with greater than 50% respiratory variability, suggesting right atrial pressure of 3 mmHg. FINDINGS  Left Ventricle: Left ventricular ejection fraction, by estimation, is 60 to 65%. The left ventricle has normal function. The left ventricle has no regional wall motion abnormalities. The left ventricular internal cavity size was normal in size. There is  moderate left ventricular hypertrophy. Left ventricular diastolic parameters were normal. Right Ventricle: The right ventricular size is normal. No increase in right ventricular wall thickness. Right ventricular systolic function is normal. Left Atrium: Left atrial size was normal in size. Right Atrium: Right atrial size was normal in size. Pericardium: There is no evidence of pericardial effusion. Mitral Valve: The mitral valve is normal in structure. Trivial mitral valve regurgitation. No evidence of mitral valve stenosis. Tricuspid Valve: The tricuspid valve is normal in structure. Tricuspid valve regurgitation is trivial. No evidence of tricuspid stenosis. Aortic Valve: The aortic valve is normal in structure. Aortic valve regurgitation is not visualized. No aortic stenosis is present. Pulmonic Valve: The pulmonic valve was  normal in structure. Pulmonic valve regurgitation is not visualized. No evidence of pulmonic stenosis. Aorta: The aortic root is normal in size and structure. Venous: The inferior vena cava is normal in size with greater than 50% respiratory variability, suggesting right atrial pressure of 3 mmHg. IAS/Shunts: No atrial level shunt detected by color flow Doppler.  LEFT VENTRICLE PLAX 2D LVIDd:         4.20 cm   Diastology LVIDs:         2.70 cm   LV e' medial:    7.83 cm/s LV PW:         0.90 cm   LV E/e' medial:  8.1 LV IVS:        0.75 cm   LV e' lateral:   10.10 cm/s LVOT diam:     2.50 cm   LV E/e' lateral: 6.3 LV SV:         59 LV SV Index:   25 LVOT Area:     4.91 cm  RIGHT VENTRICLE             IVC RV Basal diam:  3.80 cm     IVC diam: 1.20 cm RV S prime:     16.60 cm/s TAPSE (M-mode): 2.1 cm LEFT ATRIUM             Index        RIGHT ATRIUM           Index LA diam:        3.50 cm 1.47 cm/m   RA Area:     12.20 cm LA Vol (A2C):   47.3 ml 19.86 ml/m  RA Volume:   30.20 ml  12.68 ml/m LA Vol (A4C):   37.3 ml 15.66 ml/m LA Biplane Vol: 41.9 ml 17.59 ml/m  AORTIC VALVE LVOT Vmax:   66.25 cm/s LVOT Vmean:  47.200 cm/s LVOT VTI:    0.121 m  AORTA Ao Root diam: 3.20 cm Ao Asc diam:  2.90 cm MITRAL VALVE MV Area (PHT): 4.21 cm    SHUNTS MV Decel Time: 180 msec    Systemic VTI:  0.12 m MV E velocity: 63.40 cm/s  Systemic Diam: 2.50 cm MV A velocity: 62.10 cm/s MV E/A ratio:  1.02 Shaukat Khan Electronically signed by Adrian Blackwater Signature Date/Time: 10/14/2021/9:50:14 AM    Final    CT Renal Stone Study  Result  Date: 10/12/2021 CLINICAL DATA:  Acute left flank pain. EXAM: CT ABDOMEN AND PELVIS WITHOUT CONTRAST TECHNIQUE: Multidetector CT imaging of the abdomen and pelvis was performed following the standard protocol without IV contrast. RADIATION DOSE REDUCTION: This exam was performed according to the departmental dose-optimization program which includes automated exposure control, adjustment of the mA  and/or kV according to patient size and/or use of iterative reconstruction technique. COMPARISON:  September 29, 2010. FINDINGS: Lower chest: No acute abnormality. Hepatobiliary: No gallstones or biliary dilatation is noted. Hepatic steatosis is noted. Pancreas: Unremarkable. No pancreatic ductal dilatation or surrounding inflammatory changes. Spleen: Normal in size without focal abnormality. Adrenals/Urinary Tract: Adrenal glands are unremarkable. Kidneys are normal, without renal calculi, focal lesion, or hydronephrosis. Bladder is unremarkable. Stomach/Bowel: Stomach is within normal limits. Appendix appears normal. No evidence of bowel wall thickening, distention, or inflammatory changes. Vascular/Lymphatic: No significant vascular findings are present. No enlarged abdominal or pelvic lymph nodes. Reproductive: Prostate is unremarkable. Other: No abdominal wall hernia or abnormality. No abdominopelvic ascites. Musculoskeletal: No acute or significant osseous findings. IMPRESSION: Hepatic steatosis. No other abnormality seen in the abdomen or pelvis. Electronically Signed   By: Lupita Raider M.D.   On: 10/12/2021 12:39      Subjective: Seen and examined on day of discharge.  Stable no distress.  Ambulating around halls.  No pain complaints.  Stable for discharge home.  Discharge Exam: Vitals:   10/14/21 0526 10/14/21 0835  BP: 98/62 110/65  Pulse: 71 83  Resp: 16 16  Temp: 97.9 F (36.6 C) 98 F (36.7 C)  SpO2: 100% 100%   Vitals:   10/13/21 2006 10/13/21 2235 10/14/21 0526 10/14/21 0835  BP: 104/67  98/62 110/65  Pulse: 87 85 71 83  Resp: Temp: 99.1 F (37.3 C)  97.9 F (36.6 C) 98 F (36.7 C)  TempSrc: Oral   Oral  SpO2: 100% 98% 100% 100%  Weight:      Height:        General: Pt is alert, awake, not in acute distress Cardiovascular: RRR, S1/S2 +, no rubs, no gallops Respiratory: CTA bilaterally, no wheezing, no rhonchi Abdominal: Soft, NT, ND, bowel sounds  + Extremities: no edema, no cyanosis    The results of significant diagnostics from this hospitalization (including imaging, microbiology, ancillary and laboratory) are listed below for reference.     Microbiology: Recent Results (from the past 240 hour(s))  Resp Panel by RT-PCR (Flu A&B, Covid) Nasopharyngeal Swab     Status: None   Collection Time: 10/12/21 12:17 PM   Specimen: Nasopharyngeal Swab; Nasopharyngeal(NP) swabs in vial transport medium  Result Value Ref Range Status   SARS Coronavirus 2 by RT PCR NEGATIVE NEGATIVE Final    Comment: (NOTE) SARS-CoV-2 target nucleic acids are NOT DETECTED.  The SARS-CoV-2 RNA is generally detectable in upper respiratory specimens during the acute phase of infection. The lowest concentration of SARS-CoV-2 viral copies this assay can detect is 138 copies/mL. A negative result does not preclude SARS-Cov-2 infection and should not be used as the sole basis for treatment or other patient management decisions. A negative result may occur with  improper specimen collection/handling, submission of specimen other than nasopharyngeal swab, presence of viral mutation(s) within the areas targeted by this assay, and inadequate number of viral copies(<138 copies/mL). A negative result must be combined with clinical observations, patient history, and epidemiological information. The expected result is Negative.  Fact Sheet for Patients:  BloggerCourse.com  Fact  Sheet for Healthcare Providers:  SeriousBroker.it  This test is no t yet approved or cleared by the [place] FDA and  has been authorized for detection and/or diagnosis of SARS-CoV-2 by FDA under an Emergency Use Authorization (EUA). This EUA will remain  in effect (meaning this test can be used) for the duration of the COVID-19 declaration under Section 564(b)(1) of the Act, 21 U.S.C.section 360bbb-3(b)(1), unless the authorization  is terminated  or revoked sooner.       Influenza A by PCR NEGATIVE NEGATIVE Final   Influenza B by PCR NEGATIVE NEGATIVE Final    Comment: (NOTE) The Xpert Xpress SARS-CoV-2/FLU/RSV plus assay is intended as an aid in the diagnosis of influenza from Nasopharyngeal swab specimens and should not be used as a sole basis for treatment. Nasal washings and aspirates are unacceptable for Xpert Xpress SARS-CoV-2/FLU/RSV testing.  Fact Sheet for Patients: BloggerCourse.com  Fact Sheet for Healthcare Providers: SeriousBroker.it  This test is not yet approved or cleared by the [place] FDA and has been authorized for detection and/or diagnosis of SARS-CoV-2 by FDA under an Emergency Use Authorization (EUA). This EUA will remain in effect (meaning this test can be used) for the duration of the COVID-19 declaration under Section 564(b)(1) of the Act, 21 U.S.C. section 360bbb-3(b)(1), unless the authorization is terminated or revoked.  Performed at [facility], [address] [place], [place] [address]      Labs: BNP (last 3 results) No results for input(s): BNP in the last 8760 hours. Basic Metabolic Panel: Recent Labs  Lab 10/12/21 1117 10/13/21 0440 10/14/21 1030  NA 139 138 136  K 4.1 3.9 4.1  CL 103 105 105  CO2 28 26 23   GLUCOSE 106* 144* 133*  BUN 61* 46* 36*  CREATININE 3.15* 2.55* 1.65*  CALCIUM 9.4 9.6 9.5   Liver Function Tests: Recent Labs  Lab 10/12/21 1117  AST 16  ALT 30  ALKPHOS 36*  BILITOT 0.7  PROT 8.3*  ALBUMIN 4.7   Recent Labs  Lab 10/12/21 1117  LIPASE 64*   No results for input(s): AMMONIA in the last 168 hours. CBC: Recent Labs  Lab 10/12/21 1117 10/13/21 0440  WBC 7.4 7.0  HGB 11.1* 10.1*  HCT 36.5* 33.0*  MCV 71.9* 71.9*  PLT 313 278   Cardiac Enzymes: Recent Labs  Lab 10/12/21 1117  CKTOTAL 200   BNP: Invalid input(s): POCBNP CBG: Recent Labs  Lab  10/13/21 0805 10/13/21 1116 10/13/21 1629 10/14/21 0832 10/14/21 1151  GLUCAP 120* 129* 94 117* 92   D-Dimer No results for input(s): DDIMER in the last 72 hours. Hgb A1c Recent Labs    10/13/21 0440  HGBA1C 7.4*   Lipid Profile No results for input(s): CHOL, HDL, LDLCALC, TRIG, CHOLHDL, LDLDIRECT in the last 72 hours. Thyroid function studies No results for input(s): TSH, T4TOTAL, T3FREE, THYROIDAB in the last 72 hours.  Invalid input(s): FREET3 Anemia work up No results for input(s): VITAMINB12, FOLATE, FERRITIN, TIBC, IRON, RETICCTPCT in the last 72 hours. Urinalysis    Component Value Date/Time   COLORURINE STRAW (A) 10/12/2021 1117   APPEARANCEUR CLEAR (A) 10/12/2021 1117   LABSPEC 1.016 10/12/2021 1117   PHURINE 5.0 10/12/2021 1117   GLUCOSEU 150 (A) 10/12/2021 1117   HGBUR MODERATE (A) 10/12/2021 1117   BILIRUBINUR NEGATIVE 10/12/2021 1117   KETONESUR NEGATIVE 10/12/2021 1117   PROTEINUR 30 (A) 10/12/2021 1117   NITRITE NEGATIVE 10/12/2021 1117   LEUKOCYTESUR NEGATIVE 10/12/2021 1117  Sepsis Labs Invalid input(s): PROCALCITONIN,  WBC,  LACTICIDVEN Microbiology Recent Results (from the past 240 hour(s))  Resp Panel by RT-PCR (Flu A&B, Covid) Nasopharyngeal Swab     Status: None   Collection Time: 10/12/21 12:17 PM   Specimen: Nasopharyngeal Swab; Nasopharyngeal(NP) swabs in vial transport medium  Result Value Ref Range Status   SARS Coronavirus 2 by RT PCR NEGATIVE NEGATIVE Final    Comment: (NOTE) SARS-CoV-2 target nucleic acids are NOT DETECTED.  The SARS-CoV-2 RNA is generally detectable in upper respiratory specimens during the acute phase of infection. The lowest concentration of SARS-CoV-2 viral copies this assay can detect is 138 copies/mL. A negative result does not preclude SARS-Cov-2 infection and should not be used as the sole basis for treatment or other patient management decisions. A negative result may occur with  improper specimen  collection/handling, submission of specimen other than nasopharyngeal swab, presence of viral mutation(s) within the areas targeted by this assay, and inadequate number of viral copies(<138 copies/mL). A negative result must be combined with clinical observations, patient history, and epidemiological information. The expected result is Negative.  Fact Sheet for Patients:  BloggerCourse.com  Fact Sheet for Healthcare Providers:  SeriousBroker.it  This test is no t yet approved or cleared by the [place] FDA and  has been authorized for detection and/or diagnosis of SARS-CoV-2 by FDA under an Emergency Use Authorization (EUA). This EUA will remain  in effect (meaning this test can be used) for the duration of the COVID-19 declaration under Section 564(b)(1) of the Act, 21 U.S.C.section 360bbb-3(b)(1), unless the authorization is terminated  or revoked sooner.       Influenza A by PCR NEGATIVE NEGATIVE Final   Influenza B by PCR NEGATIVE NEGATIVE Final    Comment: (NOTE) The Xpert Xpress SARS-CoV-2/FLU/RSV plus assay is intended as an aid in the diagnosis of influenza from Nasopharyngeal swab specimens and should not be used as a sole basis for treatment. Nasal washings and aspirates are unacceptable for Xpert Xpress SARS-CoV-2/FLU/RSV testing.  Fact Sheet for Patients: BloggerCourse.com  Fact Sheet for Healthcare Providers: SeriousBroker.it  This test is not yet approved or cleared by the [place] FDA and has been authorized for detection and/or diagnosis of SARS-CoV-2 by FDA under an Emergency Use Authorization (EUA). This EUA will remain in effect (meaning this test can be used) for the duration of the COVID-19 declaration under Section 564(b)(1) of the Act, 21 U.S.C. section 360bbb-3(b)(1), unless the authorization is terminated or revoked.  Performed at [facility], [address] [place], [place] [address]      Time coordinating discharge: Over 30 minutes  SIGNED:   Tresa Moore, MD  [facility] 10/14/2021, 1:30 PM Pager   If 7PM-7AM, please contact night-coverage

## 2021-10-14 NOTE — Plan of Care (Signed)

## 2021-10-14 NOTE — TOC CM/SW Note (Signed)
Patient has orders to discharge home today. Chart reviewed. PCP is James Hedrick, MD. On room air. No wounds. No TOC needs identified. CSW signing off.  Jadzia Ibsen, CSW [phone]  

## 2021-10-14 NOTE — Progress Notes (Signed)
Patient discharged to discharge unit with no complications. VSS. Patient given discharge instructions and diabetes teaching (follow-up questions). Patient has no medications being sent with him. PIVX1 removed. Patient has no questions or complaints.

## 2021-10-27 DIAGNOSIS — R9439 Abnormal result of other cardiovascular function study: Secondary | ICD-10-CM | POA: Insufficient documentation

## 2022-04-27 DIAGNOSIS — E782 Mixed hyperlipidemia: Secondary | ICD-10-CM | POA: Insufficient documentation

## 2022-05-12 ENCOUNTER — Encounter: Payer: Self-pay | Admitting: *Deleted

## 2022-05-12 ENCOUNTER — Encounter: Payer: 59 | Attending: Family Medicine | Admitting: *Deleted

## 2022-05-12 VITALS — BP 108/60 | Ht 71.0 in | Wt 182.9 lb

## 2022-05-12 DIAGNOSIS — E119 Type 2 diabetes mellitus without complications: Secondary | ICD-10-CM | POA: Diagnosis present

## 2022-05-12 DIAGNOSIS — Z713 Dietary counseling and surveillance: Secondary | ICD-10-CM | POA: Diagnosis not present

## 2022-05-12 NOTE — Progress Notes (Signed)
Diabetes Self-Management Education  Visit Type: First/Initial  Appt. Start Time: 1325 Appt. End Time: 1440  05/12/2022  Mr. Jesus Travis, identified by name and date of birth, is a 46 y.o. male with a diagnosis of Diabetes: Type 2.   ASSESSMENT  Blood pressure 108/60, height 5\' 11"  (1.803 m), weight 182 lb 14.4 oz (83 kg). Body mass index is 25.51 kg/m.   Diabetes Self-Management Education - 05/12/22 1455       Visit Information   Visit Type First/Initial      Initial Visit   Diabetes Type Type 2    Date Diagnosed February 2023    Are you currently following a meal plan? Yes    What type of meal plan do you follow? cut back on carbs    Are you taking your medications as prescribed? Yes      Health Coping   How would you rate your overall health? Good      Psychosocial Assessment   Patient Belief/Attitude about Diabetes Other (comment)   "not good at all. I don't know what to ear or how much. It's something that stress me out the most"   What is the hardest part about your diabetes right now, causing you the most concern, or is the most worrisome to you about your diabetes?   Making healty food and beverage choices    Self-care barriers None    Self-management support Doctor's office;Family    Patient Concerns Nutrition/Meal planning;Monitoring;Glycemic Control;Healthy Lifestyle;Other (comment)   I want to reverse my diabetes and put my A1C in a normal range forever.   Special Needs None    Preferred Learning Style Auditory;Visual;Hands on    Learning Readiness Change in progress    How often do you need to have someone help you when you read instructions, pamphlets, or other written materials from your doctor or pharmacy? 1 - Never    What is the last grade level you completed in school? some college      Pre-Education Assessment   Patient understands the diabetes disease and treatment process. Needs Instruction    Patient understands incorporating nutritional  management into lifestyle. Needs Instruction    Patient undertands incorporating physical activity into lifestyle. Demonstrates understanding / competency    Patient understands using medications safely. Needs Instruction    Patient understands monitoring blood glucose, interpreting and using results Comprehends key points    Patient understands prevention, detection, and treatment of acute complications. Needs Instruction    Patient understands prevention, detection, and treatment of chronic complications. Needs Review    Patient understands how to develop strategies to address psychosocial issues. Needs Instruction    Patient understands how to develop strategies to promote health/change behavior. Needs Instruction      Complications   Last HgB A1C per patient/outside source 6.5 %   04/12/2022   How often do you check your blood sugar? 3-4 times / week    Fasting Blood glucose range (mg/dL) 06/12/2022   he had 2 readings of 88 and 93 mg/dL   Postprandial Blood glucose range (mg/dL) 63-016   more post meal readings 76-111 mg/dL   Have you had a dilated eye exam in the past 12 months? Yes    Have you had a dental exam in the past 12 months? Yes    Are you checking your feet? No      Dietary Intake   Breakfast 2 boiled or scrambled eggs and 2 pieces of wheat toast ([employer]  Bread); protein shake, Greek yogurt   Snack (morning) peanut butter, nuts; fruit - apple, nectarine    Lunch fruit; meatloaf sandwich; burger on bread; soft tacos    Snack (afternoon) same as am    Chief Financial Officer, chicken, [place] burger, shrimp, fish, veggie pizza; black beans, black-eyed peas, collards, green,s gree beans, cauliflower rice    Snack (evening) same as am    Beverage(s) water, Geneticist, molecular      Activity / Exercise   Activity / Exercise Type Light (walking / raking leaves)    How many days per week do you exercise? 5    How many minutes per day do you exercise? 60    Total minutes per  week of exercise 300      Patient Education   Previous Diabetes Education No    Disease Pathophysiology Definition of diabetes, type 1 and 2, and the diagnosis of diabetes;Factors that contribute to the development of diabetes;Explored patient's options for treatment of their diabetes    Healthy Eating Role of diet in the treatment of diabetes and the relationship between the three main macronutrients and blood glucose level;Food label reading, portion sizes and measuring food.;Reviewed blood glucose goals for pre and post meals and how to evaluate the patients' food intake on their blood glucose level.    Being Active Role of exercise on diabetes management, blood pressure control and cardiac health.    Monitoring Purpose and frequency of SMBG.;Taught/discussed recording of test results and interpretation of SMBG.;Interpreting lab values - A1C, lipid, urine microalbumina.;Identified appropriate SMBG and/or A1C goals.    Chronic complications Relationship between chronic complications and blood glucose control    Diabetes Stress and Support Identified and addressed patients feelings and concerns about diabetes;Role of stress on diabetes      Individualized Goals (developed by patient)   Reducing Risk Other (comment)   improve blood sugars, prevent diabetes complications, lead a healthier lifestyle, become more fit, reverse diabetes     Outcomes   Expected Outcomes Demonstrated interest in learning. Expect positive outcomes    Future DMSE 2 wks         Individualized Plan for Diabetes Self-Management Training:   Learning Objective:  Patient will have a greater understanding of diabetes self-management. Patient education plan is to attend individual and/or group sessions per assessed needs and concerns.   Plan:   Patient Instructions  Check blood sugars before breakfast or 2 hrs after one meal 3 x week Bring blood sugar records to the next class  Exercise: Continue walking for    60   minutes   5  days a week  Eat 3 meals day,   2-3  snacks a day Space meals 4-6 hours apart Include at least 1 serving of carbohydrates with meals Include 1 serving of protein when eating fruit for a snack or meal  Return for classes on:     Expected Outcomes:  Demonstrated interest in learning. Expect positive outcomes  Education material provided:  General Meal Planning Guidelines Simple Meal Plan  If problems or questions, patient to contact team via:   Sharion Settler, RN, CCM, CDCES [phone]  Future DSME appointment: 2 wks May 30, 2022 for Diabetes Class 1

## 2022-05-12 NOTE — Patient Instructions (Signed)
Check blood sugars before breakfast or 2 hrs after one meal 3 x week Bring blood sugar records to the next class  Exercise: Continue walking for    60  minutes   5  days a week  Eat 3 meals day,   2-3  snacks a day Space meals 4-6 hours apart Include at least 1 serving of carbohydrates with meals Include 1 serving of protein when eating fruit for a snack or meal  Return for classes on:

## 2022-05-17 ENCOUNTER — Other Ambulatory Visit: Payer: Self-pay | Admitting: Internal Medicine

## 2022-05-17 DIAGNOSIS — I10 Essential (primary) hypertension: Secondary | ICD-10-CM

## 2022-05-17 DIAGNOSIS — E782 Mixed hyperlipidemia: Secondary | ICD-10-CM

## 2022-05-18 ENCOUNTER — Ambulatory Visit
Admission: RE | Admit: 2022-05-18 | Discharge: 2022-05-18 | Disposition: A | Discharge status: Home / Self Care | Payer: 59 | Source: Ambulatory Visit | Attending: Internal Medicine | Admitting: Internal Medicine

## 2022-05-18 DIAGNOSIS — I10 Essential (primary) hypertension: Secondary | ICD-10-CM | POA: Insufficient documentation

## 2022-05-18 DIAGNOSIS — E782 Mixed hyperlipidemia: Secondary | ICD-10-CM | POA: Insufficient documentation

## 2022-05-19 ENCOUNTER — Telehealth: Payer: Self-pay | Admitting: *Deleted

## 2022-05-19 NOTE — Telephone Encounter (Signed)
Received voice mail from patient with questions about snacks and carbohydrates with meals. Discussed snacks with protein or small carb serving and when eating fruit to add a protein. Reviewed not skipping carbohydrates at meals and having at least 1 serving at each meal. Patient will return for Diabetes classes on Sept 25.

## 2022-05-30 ENCOUNTER — Encounter: Payer: Self-pay | Admitting: Dietician

## 2022-05-30 ENCOUNTER — Encounter: Payer: 59 | Admitting: Dietician

## 2022-05-30 VITALS — Ht 71.0 in | Wt 183.7 lb

## 2022-05-30 DIAGNOSIS — E119 Type 2 diabetes mellitus without complications: Secondary | ICD-10-CM

## 2022-05-30 NOTE — Progress Notes (Signed)

## 2022-06-06 ENCOUNTER — Encounter: Payer: Self-pay | Admitting: *Deleted

## 2022-06-06 ENCOUNTER — Encounter: Payer: 59 | Attending: Family Medicine | Admitting: *Deleted

## 2022-06-06 VITALS — Wt 179.1 lb

## 2022-06-06 DIAGNOSIS — Z713 Dietary counseling and surveillance: Secondary | ICD-10-CM | POA: Insufficient documentation

## 2022-06-06 DIAGNOSIS — Z6824 Body mass index (BMI) 24.0-24.9, adult: Secondary | ICD-10-CM | POA: Insufficient documentation

## 2022-06-06 DIAGNOSIS — E119 Type 2 diabetes mellitus without complications: Secondary | ICD-10-CM | POA: Insufficient documentation

## 2022-06-06 NOTE — Progress Notes (Signed)

## 2022-06-13 ENCOUNTER — Encounter: Payer: Self-pay | Admitting: *Deleted

## 2022-06-13 ENCOUNTER — Encounter: Payer: 59 | Admitting: *Deleted

## 2022-06-13 VITALS — BP 108/60 | Wt 178.9 lb

## 2022-06-13 DIAGNOSIS — E119 Type 2 diabetes mellitus without complications: Secondary | ICD-10-CM | POA: Diagnosis not present

## 2022-06-13 NOTE — Progress Notes (Signed)

## 2022-06-15 ENCOUNTER — Encounter: Payer: Self-pay | Admitting: *Deleted

## 2022-06-28 ENCOUNTER — Other Ambulatory Visit: Payer: Self-pay | Admitting: Physician Assistant

## 2022-06-28 DIAGNOSIS — R413 Other amnesia: Secondary | ICD-10-CM

## 2022-06-28 DIAGNOSIS — R42 Dizziness and giddiness: Secondary | ICD-10-CM

## 2022-07-04 ENCOUNTER — Encounter (INDEPENDENT_AMBULATORY_CARE_PROVIDER_SITE_OTHER): Payer: Self-pay

## 2022-07-13 ENCOUNTER — Ambulatory Visit
Admission: RE | Admit: 2022-07-13 | Discharge: 2022-07-13 | Disposition: A | Discharge status: Home / Self Care | Payer: 59 | Source: Ambulatory Visit | Attending: Physician Assistant | Admitting: Physician Assistant

## 2022-07-13 DIAGNOSIS — R413 Other amnesia: Secondary | ICD-10-CM

## 2022-07-13 DIAGNOSIS — R42 Dizziness and giddiness: Secondary | ICD-10-CM

## 2022-07-19 ENCOUNTER — Other Ambulatory Visit: Payer: Self-pay | Admitting: Physician Assistant

## 2022-07-19 DIAGNOSIS — G35 Multiple sclerosis: Secondary | ICD-10-CM

## 2022-07-19 DIAGNOSIS — R42 Dizziness and giddiness: Secondary | ICD-10-CM

## 2022-07-19 DIAGNOSIS — R413 Other amnesia: Secondary | ICD-10-CM

## 2022-07-21 NOTE — Progress Notes (Signed)
Patient for DG Lumbar Puncture on Wednesday 08/03/2022, I called and spoke with the patient on the phone and gave pre-procedure instructions. Pt was made aware to be here at 8:30a at the new entrance. Pt stated understanding.  Called 07/20/2022

## 2022-07-25 DIAGNOSIS — Z8271 Family history of polycystic kidney: Secondary | ICD-10-CM | POA: Insufficient documentation

## 2022-08-03 ENCOUNTER — Other Ambulatory Visit: Payer: Self-pay

## 2022-08-03 ENCOUNTER — Ambulatory Visit
Admission: RE | Admit: 2022-08-03 | Discharge: 2022-08-03 | Disposition: A | Discharge status: Home / Self Care | Payer: 59 | Source: Ambulatory Visit | Attending: Physician Assistant | Admitting: Physician Assistant

## 2022-08-03 DIAGNOSIS — G35 Multiple sclerosis: Secondary | ICD-10-CM | POA: Diagnosis present

## 2022-08-03 DIAGNOSIS — R42 Dizziness and giddiness: Secondary | ICD-10-CM | POA: Diagnosis not present

## 2022-08-03 HISTORY — DX: Sleep apnea, unspecified: G47.30

## 2022-08-03 LAB — CSF CELL COUNT WITH DIFFERENTIAL
Eosinophils, CSF: 0 % (ref 0–1)
Lymphs, CSF: 100 % — ABNORMAL HIGH (ref 40–80)
Monocyte-Macrophage-Spinal Fluid: 0 % — ABNORMAL LOW (ref 15–45)
RBC Count, CSF: 126 /mm3 — ABNORMAL HIGH
Segmented Neutrophils-CSF: 0 % (ref 0–6)
Tube #: 3
WBC, CSF: 14 /mm3 (ref 0–5)

## 2022-08-03 LAB — PATHOLOGIST SMEAR REVIEW

## 2022-08-03 LAB — PROTEIN AND GLUCOSE, CSF
Glucose, CSF: 52 mg/dL (ref 40–70)
Total  Protein, CSF: 43 mg/dL (ref 15–45)

## 2022-08-03 MED ORDER — LIDOCAINE HCL (PF) 1 % IJ SOLN
10.0000 mL | Freq: Once | INTRAMUSCULAR | Status: AC
  Administered 2022-08-03: 10 mL

## 2022-08-03 MED ORDER — ACETAMINOPHEN 325 MG PO TABS
650.0000 mg | ORAL_TABLET | ORAL | Status: DC | PRN
  Administered 2022-08-03: 650 mg via ORAL
  Filled 2022-08-03 (×2): qty 2

## 2022-08-03 MED ORDER — ACETAMINOPHEN 325 MG PO TABS
ORAL_TABLET | ORAL | Status: AC
  Filled 2022-08-03: qty 2

## 2022-08-03 NOTE — Progress Notes (Signed)
Notified matthew segal, pa of wbc of 14,

## 2022-08-03 NOTE — Procedures (Signed)
Technically successful fluoro guided LP at L4-L5 level with opening pressure of 18 cm H2O. 8 cc of clear CSF sent to lab for analysis.  No immediate post procedural complication.  Please see imaging section of Epic for full dictation.    Mina Marble, PA-C 08/03/2022, 10:13 AM

## 2022-08-03 NOTE — Progress Notes (Signed)
[facility] RN Dalbert Mayotte called to alert this provider that a critical value of 14 was reported for the CSF WBC count from lumbar puncture performed today. This provider called these results to ordering provider Nilda Calamity, PA-C from [facility]. The lab critical value was conveyed to her. In consultation with her attending Dr Malvin Johns, it was conveyed to this provider that as long as the patient was not experiencing a fever, that the patient could be released as normal after observation in [facility] bay.   When the patient was examined, the patient had a temperature of 98.44F. The patient denies any feelings of fever, chills, or body aches. The patient was given strict return precautions to call his neurology office or present to the ED if any fever or chills develop after he is discharged. The patient and his wife verbally confirmed their understanding.  Jesus Francois, PA-C 08/03/2022 11:21 AM

## 2022-08-05 LAB — OLIGOCLONAL BANDS, CSF + SERM

## 2022-08-12 ENCOUNTER — Ambulatory Visit
Admission: RE | Admit: 2022-08-12 | Discharge: 2022-08-12 | Disposition: A | Discharge status: Home / Self Care | Payer: 59 | Source: Ambulatory Visit | Attending: Physician Assistant | Admitting: Physician Assistant

## 2022-08-12 DIAGNOSIS — R413 Other amnesia: Secondary | ICD-10-CM

## 2022-08-12 DIAGNOSIS — R42 Dizziness and giddiness: Secondary | ICD-10-CM

## 2022-08-12 DIAGNOSIS — G35 Multiple sclerosis: Secondary | ICD-10-CM

## 2022-08-12 DIAGNOSIS — G35D Multiple sclerosis, unspecified: Secondary | ICD-10-CM

## 2022-08-12 MED ORDER — GADOBENATE DIMEGLUMINE 529 MG/ML IV SOLN
15.0000 mL | Freq: Once | INTRAVENOUS | Status: AC | PRN
  Administered 2022-08-12: 15 mL via INTRAVENOUS

## 2022-10-25 DIAGNOSIS — E119 Type 2 diabetes mellitus without complications: Secondary | ICD-10-CM | POA: Insufficient documentation

## 2022-11-17 DIAGNOSIS — G35 Multiple sclerosis: Secondary | ICD-10-CM | POA: Insufficient documentation

## 2022-12-21 ENCOUNTER — Other Ambulatory Visit: Payer: Self-pay | Admitting: Neurology

## 2022-12-21 DIAGNOSIS — G35 Multiple sclerosis: Secondary | ICD-10-CM

## 2023-01-11 ENCOUNTER — Ambulatory Visit
Admission: RE | Admit: 2023-01-11 | Discharge: 2023-01-11 | Disposition: A | Discharge status: Home / Self Care | Payer: 59 | Source: Ambulatory Visit | Attending: Neurology | Admitting: Neurology

## 2023-01-11 DIAGNOSIS — G35 Multiple sclerosis: Secondary | ICD-10-CM

## 2023-01-11 MED ORDER — GADOPICLENOL 0.5 MMOL/ML IV SOLN
7.5000 mL | Freq: Once | INTRAVENOUS | Status: AC | PRN
  Administered 2023-01-11: 7.5 mL via INTRAVENOUS

## 2023-03-02 ENCOUNTER — Other Ambulatory Visit: Payer: Self-pay

## 2023-03-02 DIAGNOSIS — M7989 Other specified soft tissue disorders: Secondary | ICD-10-CM

## 2023-03-03 ENCOUNTER — Ambulatory Visit
Admission: RE | Admit: 2023-03-03 | Discharge: 2023-03-03 | Disposition: A | Discharge status: Home / Self Care | Payer: 59 | Source: Ambulatory Visit | Attending: Family Medicine | Admitting: Family Medicine

## 2023-03-03 DIAGNOSIS — M7989 Other specified soft tissue disorders: Secondary | ICD-10-CM | POA: Diagnosis present

## 2023-06-08 ENCOUNTER — Ambulatory Visit (INDEPENDENT_AMBULATORY_CARE_PROVIDER_SITE_OTHER): Payer: 59 | Admitting: Podiatry

## 2023-06-08 DIAGNOSIS — Q666 Other congenital valgus deformities of feet: Secondary | ICD-10-CM

## 2023-06-08 DIAGNOSIS — B999 Unspecified infectious disease: Secondary | ICD-10-CM

## 2023-06-08 MED ORDER — TERBINAFINE HCL 250 MG PO TABS: 250.0000 mg | ORAL_TABLET | Freq: Every day | ORAL | 0 refills | Status: AC

## 2023-06-08 MED ORDER — DOXYCYCLINE HYCLATE 100 MG PO TABS: 100.0000 mg | ORAL_TABLET | Freq: Two times a day (BID) | ORAL | 0 refills | Status: DC

## 2023-06-08 NOTE — Progress Notes (Signed)
Subjective:  Patient ID: Jesus Travis, male    DOB: 1976-08-25,  MRN: [id number]  Chief Complaint  Patient presents with   Callouses    47 y.o. male presents with the above complaint.  Patient presents with complaint of fourth interspace superinfection.  Patient is a there are some redness associated with it with some whitish tissue.  He wanted get it evaluated he has not seen and was started him he is a diabetic.  Pes cavus foot oriented no acute picture.  He has not been doing anything for is not taking any antibiotics.  He may be developing some superinfection.  He would also like to discuss orthotics as well.  He denies any other acute complaints.   Review of Systems: Negative except as noted in the HPI. Denies N/V/F/Ch.  Past Medical History:  Diagnosis Date   CVA (cerebral infarction) 09/05/2009   mild    Diabetes mellitus without complication (HCC)    Hypertension    Sleep apnea     Current Outpatient Medications:    doxycycline (VIBRA-TABS) 100 MG tablet, Take 1 tablet (100 mg total) by mouth 2 (two) times daily., Disp: 28 tablet, Rfl: 0   terbinafine (LAMISIL) 250 MG tablet, Take 1 tablet (250 mg total) by mouth daily., Disp: 30 tablet, Rfl: 0   carvedilol (COREG) 3.125 MG tablet, Take 3.125 mg by mouth 2 (two) times daily. (Patient not taking: Reported on 06/13/2022), Disp: , Rfl:    diazepam (VALIUM) 2 MG tablet, Take 2 mg by mouth 60 (sixty) minutes before procedure. As needed pre-procedure, Disp: , Rfl:    pantoprazole (PROTONIX) 40 MG tablet, Take 40 mg by mouth daily as needed., Disp: , Rfl: 1   SODIUM FLUORIDE, DENTAL RINSE, 0.2 % SOLN, Take 0.5 Capfuls by mouth once a week., Disp: , Rfl:    Vitamin D, Ergocalciferol, (DRISDOL) 1.25 MG (50000 UT) CAPS capsule, Take 50,000 Units by mouth every Friday., Disp: , Rfl:   Social History   Tobacco Use  Smoking Status Never  Smokeless Tobacco Never    Allergies  Allergen Reactions   Pork-Derived Products Other  (See Comments)    "Pt states he feels bad"   Objective:  There were no vitals filed for this visit. There is no height or weight on file to calculate BMI. Constitutional Well developed. Well nourished.  Vascular Dorsalis pedis pulses palpable bilaterally. Posterior tibial pulses palpable bilaterally. Capillary refill normal to all digits.  No cyanosis or clubbing noted. Pedal hair growth normal.  Neurologic Normal speech. Oriented to person, place, and time. Epicritic sensation to light touch grossly present bilaterally.  Dermatologic Nails well groomed and normal in appearance. No open wounds. No skin lesions.  Orthopedic: Superinfection noted to the interspace with interdigital maceration no open wound or lesion noted.  Mild erythema noted.  No purulent drainage noted.  Gait examination shows pes planovalgus foot structure with calcaneovalgus to many toe signs partially recurred the arch with dorsiflexion of the hallux.  Unable to perform single and double heel raise   Radiographs: None Assessment:   1. Superinfection   2. Pes planovalgus    Plan:  Patient was evaluated and treated and all questions answered.  Left fourth interspace superinfection -All questions and concerns were discussed with the patient in extensive detail given numerous of her infection is present patient will benefit from Lamisil and doxycycline I encouraged him to complete both of the course.  He will also benefit from painting in between the toes  with Betadine/iodine.  He states understanding will do so immediately.  Pes planovalgus -I explained to patient the etiology of pes planovalgus and relationship with Planter fasciitis and various treatment options were discussed.  Given patient foot structure in the setting of Planter fasciitis I believe patient will benefit from custom-made orthotics to help control the hindfoot motion support the arch of the foot and take the stress away from plantar fascial.   Patient agrees with the plan like to proceed with orthotics -Patient was casted for orthotics and would like to do 2 pairs   No follow-ups on file.   Pes planovalgus orthotics two pair of them   Left fourth interspace superinfection Lamisil doxycycline Betadine

## 2023-06-13 ENCOUNTER — Telehealth: Payer: Self-pay | Admitting: Podiatry

## 2023-06-13 NOTE — Telephone Encounter (Signed)
Patient was seen last week. You ask him to put iodine in between his toes.  Patient is asking how long he is suppose to put it on for?

## 2023-06-14 NOTE — Telephone Encounter (Signed)
Called pt relayed message from Dr Allena Katz. Pt understood.

## 2023-06-19 ENCOUNTER — Other Ambulatory Visit (INDEPENDENT_AMBULATORY_CARE_PROVIDER_SITE_OTHER): Payer: Self-pay | Admitting: Nurse Practitioner

## 2023-06-19 DIAGNOSIS — I83813 Varicose veins of bilateral lower extremities with pain: Secondary | ICD-10-CM

## 2023-06-22 ENCOUNTER — Encounter (INDEPENDENT_AMBULATORY_CARE_PROVIDER_SITE_OTHER): Payer: Managed Care, Other (non HMO)

## 2023-06-22 ENCOUNTER — Encounter (INDEPENDENT_AMBULATORY_CARE_PROVIDER_SITE_OTHER): Payer: Managed Care, Other (non HMO) | Admitting: Vascular Surgery

## 2023-06-26 ENCOUNTER — Ambulatory Visit (INDEPENDENT_AMBULATORY_CARE_PROVIDER_SITE_OTHER): Payer: 59

## 2023-06-26 ENCOUNTER — Ambulatory Visit (INDEPENDENT_AMBULATORY_CARE_PROVIDER_SITE_OTHER): Payer: 59 | Admitting: Vascular Surgery

## 2023-06-26 ENCOUNTER — Encounter (INDEPENDENT_AMBULATORY_CARE_PROVIDER_SITE_OTHER): Payer: Self-pay | Admitting: Vascular Surgery

## 2023-06-26 VITALS — BP 130/79 | HR 72 | Resp 16 | Ht 71.0 in | Wt 173.6 lb

## 2023-06-26 DIAGNOSIS — I890 Lymphedema, not elsewhere classified: Secondary | ICD-10-CM

## 2023-06-26 DIAGNOSIS — I83813 Varicose veins of bilateral lower extremities with pain: Secondary | ICD-10-CM

## 2023-06-26 DIAGNOSIS — I872 Venous insufficiency (chronic) (peripheral): Secondary | ICD-10-CM | POA: Diagnosis not present

## 2023-06-26 DIAGNOSIS — I10 Essential (primary) hypertension: Secondary | ICD-10-CM

## 2023-06-26 NOTE — Progress Notes (Signed)
MRN : [id number]  Jesus Travis is a 47 y.o. (March 24, 1976) male who presents with chief complaint of varicose veins hurt.  History of Present Illness:   The patient returns to the office for followup status post laser ablation of the left small  saphenous vein on 03/12/2020.  The patient notes significant increase in the pain and discomfort in association with the residual varicosities. The patient notes multiple residual varicosities which continued to hurt with dependent positions and remained tender to palpation.  He is also concerned because they are increasing in both numbers as well as size.  The patient continues to wear graduated compression stockings on a daily basis but these are not eliminating the pain and discomfort. The patient continues to use over-the-counter anti-inflammatory medications to treat the pain and related symptoms but this has not given the patient relief. The patient notes the pain in the lower extremities is causing problems with daily exercise, problems at work and even with household activities such as preparing meals and doing dishes.   The patient is status post laser ablation of the left small saphenous vein on 03/12/2020.   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      Previous venous ultrasound shows resolution of the DVT and successful ablation of the left small saphenous vein.   There is a history of DVT but no PE. There is no history of ulceration or hemorrhage. The patient denies a significant family history of varicose veins.  Duplex ultrasound of the left lower extremity venous system demonstrates a normal deep venous system.  No evidence of acute or chronic DVT.  The previously treated small saphenous vein remains ablated.  There are multiple large varicosities in the posterior calf identified No outpatient medications  have been marked as taking for the 06/26/23 encounter (Appointment) with Gilda Crease, Latina Craver, MD.    Past Medical History:  Diagnosis Date   CVA (cerebral infarction) 09/05/2009   mild    Diabetes mellitus without complication (HCC)    Hypertension    Sleep apnea     Past Surgical History:  Procedure Laterality Date   VASCULAR SURGERY     WISDOM TOOTH EXTRACTION      Social History Social History   Tobacco Use   Smoking status: Never   Smokeless tobacco: Never  Substance Use Topics   Alcohol use: No   Drug use: No    Family History Family History  Problem Relation Age of Onset   Hypertension Mother    Stroke Mother    Hypertension Father    Stroke Maternal Grandmother    Diabetes Paternal Grandmother     Allergies  Allergen Reactions   Pork-Derived Products Other (See Comments)    "Pt states he feels bad"     REVIEW OF SYSTEMS (Negative unless checked)  Constitutional: [] Weight loss  [] Fever  [] Chills Cardiac: [] Chest pain   [] Chest pressure   [] Palpitations   [] Shortness of breath when laying flat   [] Shortness of breath with exertion. Vascular:  [] Pain in legs with walking   [x] Pain in legs with standing  [x] History of DVT   [] Phlebitis   [] Swelling in legs   [x] Varicose veins   [] Non-healing ulcers Pulmonary:   [] Uses home oxygen   [] Productive cough   [] Hemoptysis   [] Wheeze  [] COPD   []   Asthma Neurologic:  [] Dizziness   [] Seizures   [x] History of stroke   [] History of TIA  [] Aphasia   [] Vissual changes   [x] Weakness or numbness in arm   [] Weakness or numbness in leg Musculoskeletal:   [] Joint swelling   [] Joint pain   [] Low back pain Hematologic:  [] Easy bruising  [] Easy bleeding   [] Hypercoagulable state   [] Anemic Gastrointestinal:  [] Diarrhea   [] Vomiting  [] Gastroesophageal reflux/heartburn   [] Difficulty swallowing. Genitourinary:  [x] Chronic kidney disease   [] Difficult urination  [] Frequent urination   [] Blood in urine Skin:  [] Rashes   [] Ulcers   Psychological:  [] History of anxiety   []  History of major depression.  Physical Examination  There were no vitals filed for this visit. There is no height or weight on file to calculate BMI. Gen: WD/WN, NAD Head: [place]/AT, No temporalis wasting.  Ear/Nose/Throat: Hearing grossly intact, nares w/o erythema or drainage, pinna without lesions Eyes: PER, EOMI, sclera nonicteric.  Neck: Supple, no gross masses.  No JVD.  Pulmonary:  Good air movement, no audible wheezing, no use of accessory muscles.  Cardiac: RRR, precordium not hyperdynamic. Vascular:  Large varicosities present, greater than 10 mm left posterior calf.  Veins are tender to palpation  Mild venous stasis changes to the legs bilaterally.  Trace soft pitting edema CEAP C3sEpAsPr Vessel Right Left  Radial Palpable Palpable  Gastrointestinal: soft, non-distended. No guarding/no peritoneal signs.  Musculoskeletal: M/S 5/5 throughout.  No deformity.  Neurologic: CN 2-12 intact. Pain and light touch intact in extremities.  Symmetrical.  Speech is fluent. Motor exam as listed above. Psychiatric: Judgment intact, Mood & affect appropriate for pt's clinical situation. Dermatologic: Venous rashes no ulcers noted.  No changes consistent with cellulitis. Lymph : No lichenification or skin changes of chronic lymphedema.  CBC Lab Results  Component Value Date   WBC 7.0 10/13/2021   HGB 10.1 (L) 10/13/2021   HCT 33.0 (L) 10/13/2021   MCV 71.9 (L) 10/13/2021   PLT 278 10/13/2021    BMET    Component Value Date/Time   NA 136 10/14/2021 1030   K 4.1 10/14/2021 1030   CL 105 10/14/2021 1030   CO2 23 10/14/2021 1030   GLUCOSE 133 (H) 10/14/2021 1030   BUN 36 (H) 10/14/2021 1030   CREATININE 1.65 (H) 10/14/2021 1030   CALCIUM 9.5 10/14/2021 1030   GFRNONAA 53 (L) 10/14/2021 1030   GFRAA >60 06/22/2017 1731   CrCl cannot be calculated (Patient's most recent lab result is older than the maximum 21 days allowed.).  COAG No  results found for: "INR", "PROTIME"  Radiology No results found.   Assessment/Plan 1. Varicose veins of both lower extremities with pain Recommend: The patient has significant recurrence of his varicosities with multiple large painful varicose veins in the posterior calf area.  The patient has had successful ablation of the previously incompetent saphenous venous system but still has persistent symptoms of pain and swelling that are having a negative impact on daily life and daily activities.  Patient should undergo injection sclerotherapy with foam to treat the residual varicosities.  The risks, benefits and alternative therapies were reviewed in detail with the patient.  All questions were answered.  The patient agrees to proceed with sclerotherapy at their convenience.  The patient will continue wearing the graduated compression stockings and using the over-the-counter pain medications to treat his symptoms.   2. Chronic venous insufficiency No surgery or intervention at this point in time.   The patient is CEAP  C4sEpAsPr   I have discussed with the patient venous insufficiency and why it  causes symptoms. I have discussed with the patient the chronic skin changes that accompany venous insufficiency and the long term sequela such as infection and ulceration.  Patient will begin wearing graduated compression stockings or compression wraps on a daily basis.  The patient will put the compression on first thing in the morning and removing them in the evening. The patient is instructed specifically not to sleep in the compression.    In addition, behavioral modification including several periods of elevation of the lower extremities during the day will be continued. I have demonstrated that proper elevation is a position with the ankles at heart level.  The patient is instructed to begin routine exercise, especially walking on a daily basis  3. Lymphedema No surgery or intervention at  this point in time.   The patient is CEAP C4sEpAsPr   I have discussed with the patient venous insufficiency and why it  causes symptoms. I have discussed with the patient the chronic skin changes that accompany venous insufficiency and the long term sequela such as infection and ulceration.  Patient will begin wearing graduated compression stockings or compression wraps on a daily basis.  The patient will put the compression on first thing in the morning and removing them in the evening. The patient is instructed specifically not to sleep in the compression.    In addition, behavioral modification including several periods of elevation of the lower extremities during the day will be continued. I have demonstrated that proper elevation is a position with the ankles at heart level.  The patient is instructed to begin routine exercise, especially walking on a daily basis  4. Essential hypertension Continue antihypertensive medications as already ordered, these medications have been reviewed and there are no changes at this time.    Levora Dredge, MD  06/26/2023 8:40 AM

## 2023-07-03 ENCOUNTER — Telehealth (INDEPENDENT_AMBULATORY_CARE_PROVIDER_SITE_OTHER): Payer: Self-pay | Admitting: Vascular Surgery

## 2023-07-03 NOTE — Telephone Encounter (Signed)
In response to pt's VM, I called patient to explain that we need to get a prior authorization before we can schedule him for the foam sclerotherapy. Pt acknowledged. We will get a PA and call patient to schedule.

## 2023-07-06 ENCOUNTER — Ambulatory Visit: Payer: 59 | Admitting: Podiatry

## 2023-07-06 ENCOUNTER — Encounter: Payer: Self-pay | Admitting: Podiatry

## 2023-07-06 DIAGNOSIS — B999 Unspecified infectious disease: Secondary | ICD-10-CM | POA: Diagnosis not present

## 2023-07-06 MED ORDER — ERYTHROMYCIN 2 % EX PADS: 1.0000 | MEDICATED_PAD | Freq: Every day | CUTANEOUS | 0 refills | Status: AC

## 2023-07-06 NOTE — Progress Notes (Signed)
Subjective:  Patient ID: Jesus Travis, male    DOB: Sep 29, 1975,  MRN: [id number]  No chief complaint on file.   47 y.o. male presents with the above complaint.  Patient presents with follow-up of fourth interspace superinfection.  He states doing little bit better but still not gone away there is still some macerated tissue.  Has been doing iodine dressings.   Review of Systems: Negative except as noted in the HPI. Denies N/V/F/Ch.  Past Medical History:  Diagnosis Date   CVA (cerebral infarction) 09/05/2009   mild    Diabetes mellitus without complication (HCC)    Hypertension    Sleep apnea     Current Outpatient Medications:    Erythromycin 2 % PADS, Apply 1 Application topically daily., Disp: 60 each, Rfl: 0   SODIUM FLUORIDE, DENTAL RINSE, 0.2 % SOLN, Take 0.5 Capfuls by mouth once a week., Disp: , Rfl:    terbinafine (LAMISIL) 250 MG tablet, Take 1 tablet (250 mg total) by mouth daily., Disp: 30 tablet, Rfl: 0   Vitamin D, Ergocalciferol, (DRISDOL) 1.25 MG (50000 UT) CAPS capsule, Take 50,000 Units by mouth every Friday., Disp: , Rfl:    diazepam (VALIUM) 2 MG tablet, Take 2 mg by mouth 60 (sixty) minutes before procedure. As needed pre-procedure, Disp: , Rfl:    pantoprazole (PROTONIX) 40 MG tablet, Take 40 mg by mouth daily as needed., Disp: , Rfl: 1  Social History   Tobacco Use  Smoking Status Never  Smokeless Tobacco Never    Allergies  Allergen Reactions   Pork-Derived Products Other (See Comments)    "Pt states he feels bad"   Objective:  There were no vitals filed for this visit. There is no height or weight on file to calculate BMI. Constitutional Well developed. Well nourished.  Vascular Dorsalis pedis pulses palpable bilaterally. Posterior tibial pulses palpable bilaterally. Capillary refill normal to all digits.  No cyanosis or clubbing noted. Pedal hair growth normal.  Neurologic Normal speech. Oriented to person, place, and time. Epicritic  sensation to light touch grossly present bilaterally.  Dermatologic Nails well groomed and normal in appearance. No open wounds. No skin lesions.  Orthopedic: Superinfection noted to the interspace with interdigital maceration no open wound or lesion noted.  Mild erythema noted.  No purulent drainage noted.  Gait examination shows pes planovalgus foot structure with calcaneovalgus to many toe signs partially recurred the arch with dorsiflexion of the hallux.  Unable to perform single and double heel raise   Radiographs: None Assessment:   No diagnosis found.  Plan:  Patient was evaluated and treated and all questions answered.  Left fourth interspace superinfection -All questions and concerns were discussed with the patient in extensive detail -clinically the maceration is still present slightly improved.  At this time I discussed with the patient that we will try erythromycin pads if there is no improvement patient will need syndactyly versus arthroplasty of the fifth digit.  I discussed with patient he states understanding.  Pes planovalgus -I explained to patient the etiology of pes planovalgus and relationship with Planter fasciitis and various treatment options were discussed.  Given patient foot structure in the setting of Planter fasciitis I believe patient will benefit from custom-made orthotics to help control the hindfoot motion support the arch of the foot and take the stress away from plantar fascial.  Patient agrees with the plan like to proceed with orthotics -Patient was casted for orthotics and would like to do 2 pairs  No follow-ups on file.

## 2023-07-07 NOTE — Progress Notes (Signed)
Orthotic order placed for two pair  Will fit when in [place]

## 2023-08-17 ENCOUNTER — Ambulatory Visit: Payer: 59 | Admitting: Podiatry

## 2023-09-19 ENCOUNTER — Ambulatory Visit (INDEPENDENT_AMBULATORY_CARE_PROVIDER_SITE_OTHER): Payer: 59 | Admitting: Podiatry

## 2023-09-19 ENCOUNTER — Encounter: Payer: Self-pay | Admitting: Podiatry

## 2023-09-19 VITALS — Ht 71.0 in | Wt 173.6 lb

## 2023-09-19 DIAGNOSIS — B999 Unspecified infectious disease: Secondary | ICD-10-CM | POA: Diagnosis not present

## 2023-09-19 DIAGNOSIS — Q666 Other congenital valgus deformities of feet: Secondary | ICD-10-CM

## 2023-09-19 MED ORDER — CASTELLANI PAINT 1.5 % EX LIQD: 1.0000 "application " | Freq: Every day | CUTANEOUS | 0 refills | Status: AC

## 2023-09-19 NOTE — Progress Notes (Signed)
  Subjective:  Patient ID: Jesus Travis, male    DOB: 01-Mar-1976,  MRN: [id number]  Chief Complaint  Patient presents with   Nail Problem    Pt is here to f/u on fungal infection    48 y.o. male presents with the above complaint.  Patient presents with follow-up of fourth interspace superinfection.  He states doing little bit better but still not gone away there is still some macerated tissue.  Has been doing iodine dressings.   Review of Systems: Negative except as noted in the HPI. Denies N/V/F/Ch.  Past Medical History:  Diagnosis Date   CVA (cerebral infarction) 09/05/2009   mild    Diabetes mellitus without complication (HCC)    Hypertension    Sleep apnea     Current Outpatient Medications:    Castellani Paint 1.5 % LIQD, Apply 1 application  topically daily., Disp: 29.57 mL, Rfl: 0   Erythromycin  2 % PADS, Apply 1 Application topically daily., Disp: 60 each, Rfl: 0   SODIUM FLUORIDE, DENTAL RINSE, 0.2 % SOLN, Take 0.5 Capfuls by mouth once a week., Disp: , Rfl:    terbinafine  (LAMISIL ) 250 MG tablet, Take 1 tablet (250 mg total) by mouth daily., Disp: 30 tablet, Rfl: 0   Vitamin D , Ergocalciferol , (DRISDOL ) 1.25 MG (50000 UT) CAPS capsule, Take 50,000 Units by mouth every Friday., Disp: , Rfl:   Social History   Tobacco Use  Smoking Status Never  Smokeless Tobacco Never    Allergies  Allergen Reactions   Pork-Derived Products Other (See Comments)    Pt states he feels bad   Objective:  There were no vitals filed for this visit. Body mass index is 24.21 kg/m. Constitutional Well developed. Well nourished.  Vascular Dorsalis pedis pulses palpable bilaterally. Posterior tibial pulses palpable bilaterally. Capillary refill normal to all digits.  No cyanosis or clubbing noted. Pedal hair growth normal.  Neurologic Normal speech. Oriented to person, place, and time. Epicritic sensation to light touch grossly present bilaterally.  Dermatologic Nails well  groomed and normal in appearance. No open wounds. No skin lesions.  Orthopedic: Slightly improving superinfection noted to the interspace with interdigital maceration no open wound or lesion noted.  Mild erythema noted.  No purulent drainage noted.  Gait examination shows pes planovalgus foot structure with calcaneovalgus to many toe signs partially recurred the arch with dorsiflexion of the hallux.  Unable to perform single and double heel raise   Radiographs: None Assessment:   1. Superinfection   2. Pes planovalgus     Plan:  Patient was evaluated and treated and all questions answered.  Left fourth interspace superinfection -All questions and concerns were discussed with the patient in extensive detail -clinically patient only had some improvement.  Still maceration present at this time I discussed Castellani paint if there is no improvement we will discuss syndactylization -Castellani paint was sent to the pharmacy  Pes planovalgus -I explained to patient the etiology of pes planovalgus and relationship with Planter fasciitis and various treatment options were discussed.  Given patient foot structure in the setting of Planter fasciitis I believe patient will benefit from custom-made orthotics to help control the hindfoot motion support the arch of the foot and take the stress away from plantar fascial.  Patient agrees with the plan like to proceed with orthotics -Patient is awaiting orthotics for his 2 pairs.   No follow-ups on file.

## 2023-09-26 ENCOUNTER — Telehealth (INDEPENDENT_AMBULATORY_CARE_PROVIDER_SITE_OTHER): Payer: Self-pay

## 2023-09-26 NOTE — Telephone Encounter (Signed)
Patient left a message checking on the status with being schedule for sclerotherapy. Patient was last seen 07/03/2023 and was recommended sclerotherapy. Please contact patient with further information

## 2023-10-02 ENCOUNTER — Telehealth: Payer: Self-pay | Admitting: Podiatry

## 2023-10-02 NOTE — Telephone Encounter (Signed)
Pt called and just got his orthotics and states they are kind of hard.  I did explain it takes a little time to break in as they are giving him support hs is not used too. I told him if it cause him pain to discontinue wearing them and call the office to get an appt

## 2023-10-26 ENCOUNTER — Encounter (INDEPENDENT_AMBULATORY_CARE_PROVIDER_SITE_OTHER): Payer: Self-pay | Admitting: Vascular Surgery

## 2023-10-26 ENCOUNTER — Ambulatory Visit (INDEPENDENT_AMBULATORY_CARE_PROVIDER_SITE_OTHER): Payer: 59 | Admitting: Vascular Surgery

## 2023-10-26 VITALS — BP 133/83 | HR 67 | Resp 18 | Ht 71.0 in | Wt 173.0 lb

## 2023-10-26 DIAGNOSIS — I83813 Varicose veins of bilateral lower extremities with pain: Secondary | ICD-10-CM | POA: Diagnosis not present

## 2023-10-26 NOTE — Progress Notes (Signed)
   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  Procedure:  Sclerotherapy using SDS foam mixed with 1% Lidocaine was performed on the left lower extremity.  Compression wraps were placed.  The patient tolerated the procedure well.  Plan:  Follow up as needed.

## 2023-10-31 ENCOUNTER — Ambulatory Visit (INDEPENDENT_AMBULATORY_CARE_PROVIDER_SITE_OTHER): Payer: 59 | Admitting: Podiatry

## 2023-10-31 DIAGNOSIS — Q666 Other congenital valgus deformities of feet: Secondary | ICD-10-CM

## 2023-10-31 DIAGNOSIS — B999 Unspecified infectious disease: Secondary | ICD-10-CM | POA: Diagnosis not present

## 2023-10-31 NOTE — Progress Notes (Unsigned)
Subjective:  Patient ID: Jesus Travis, male    DOB: [birth year],  MRN: [id number]  Chief Complaint  Patient presents with   Toe Pain   48 y.o. male returns today for follow-up for flexor tenotomy of the third digit.  Rectus alignment pathology.  Much better improvement of the distal tip callus noted  Objective:  There were no vitals filed for this visit.  General AA&O x3. Normal mood and affect.  Vascular Pedal pulses palpable.  Neurologic Epicritic sensation grossly intact.  Dermatologic No further pre-ulcerative callus at the tip of the right, 3rd toe  Orthopedic: No further semi-reducible hammertoe deformity right, 3rd toe    Assessment & Plan:  Patient was evaluated and treated and all questions answered.  Hammertoe right third with pre-ulcerative callus -Clinically healed.  Patient is officially discharged from my care if any foot and ankle issues arise future he will come back and see me.    No follow-ups on file.  Subjective:  Patient ID: Jesus Travis, male    DOB: [birth year],  MRN: [id number]  Chief Complaint  Patient presents with   Toe Pain   48 y.o. male returns today for follow-up for flexor tenotomy of the third digit.  Rectus alignment pathology.  Much better improvement of the distal tip callus noted  Objective:  There were no vitals filed for this visit.  General AA&O x3. Normal mood and affect.  Vascular Pedal pulses palpable.  Neurologic Epicritic sensation grossly intact.  Dermatologic No further pre-ulcerative callus at the tip of the right, 3rd toe  Orthopedic: No further semi-reducible hammertoe deformity right, 3rd toe    Assessment & Plan:  Patient was evaluated and treated and all questions answered.  Hammertoe right third with pre-ulcerative callus -Clinically healed.  Patient is officially discharged from my care if any foot and ankle issues arise future he will come back and see me.    No follow-ups on file.

## 2023-11-06 ENCOUNTER — Telehealth (INDEPENDENT_AMBULATORY_CARE_PROVIDER_SITE_OTHER): Payer: Self-pay

## 2023-11-06 NOTE — Telephone Encounter (Signed)
 Patient left a VM stating he had one sclero appt and would like to know the status of the authorization from the insurance to receive the other 2. Can you call this patient and give him a status or schedule him.

## 2023-11-08 ENCOUNTER — Telehealth (INDEPENDENT_AMBULATORY_CARE_PROVIDER_SITE_OTHER): Payer: Self-pay | Admitting: Vascular Surgery

## 2023-11-08 IMAGING — CT CT RENAL STONE PROTOCOL
2 of 4 series · 17 of 46 positions shown, 19 images · non-contrast
Comparison: September 29, 2010.

CLINICAL DATA: Acute left flank pain.



[Series 2: stone full standard · axial · 0.79mm/px · z∈[-924,-499]mm · 14 of 93 slices shown, 16 images]
[im 4/93  soft-tissue]
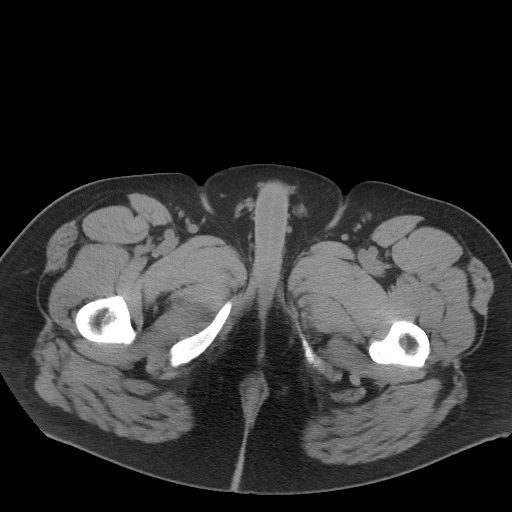
[im 4/93  bone]
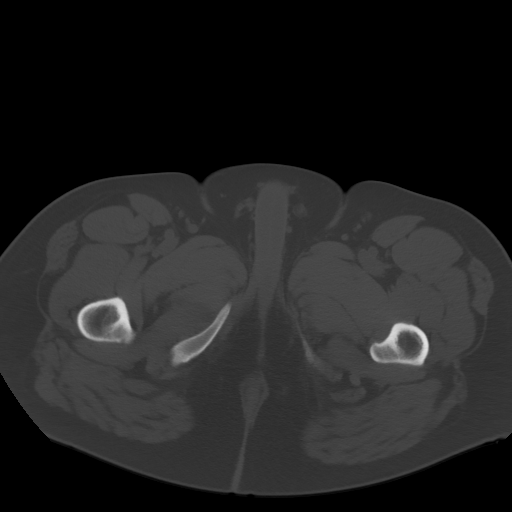
[im 12/93  soft-tissue]
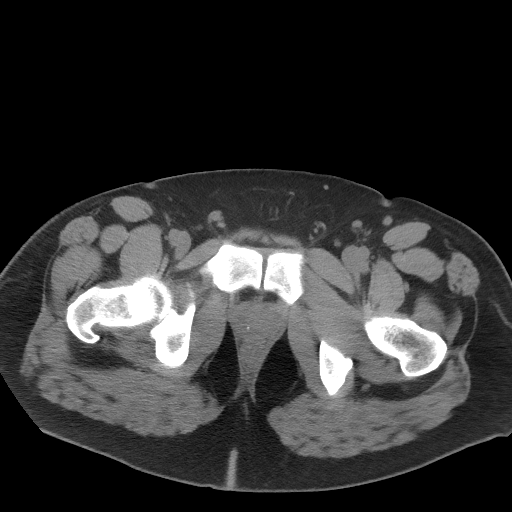
[im 19/93  soft-tissue]
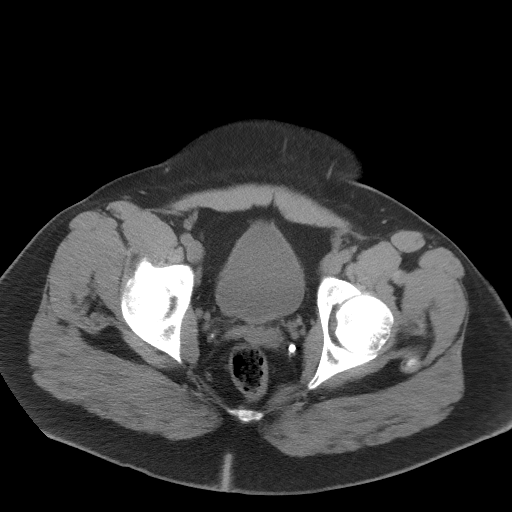
[im 26/93  soft-tissue]
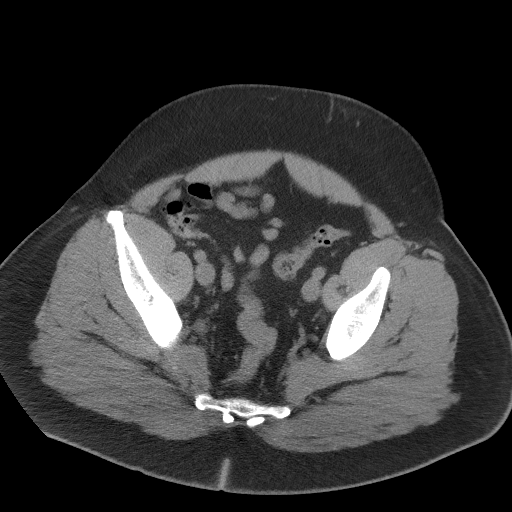
[im 30/93  soft-tissue]
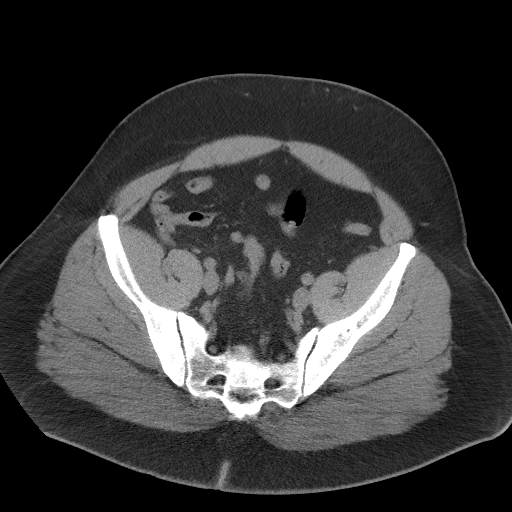
[im 37/93  soft-tissue]
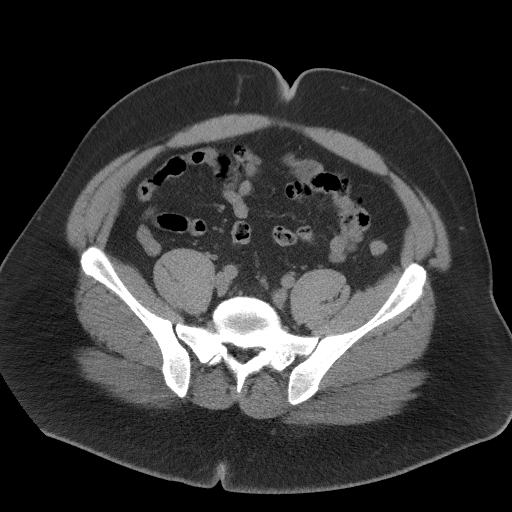
[im 45/93  soft-tissue]
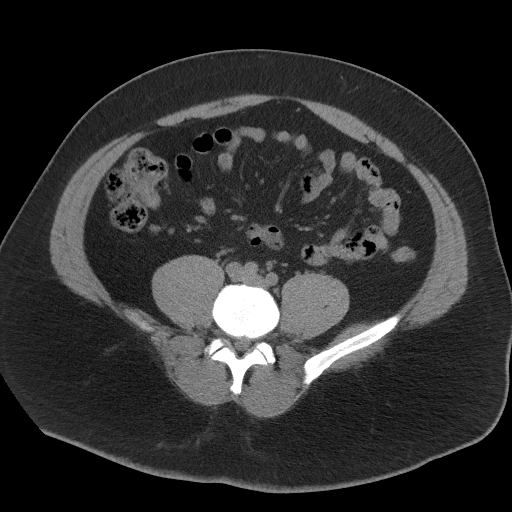
[im 48/93  soft-tissue]
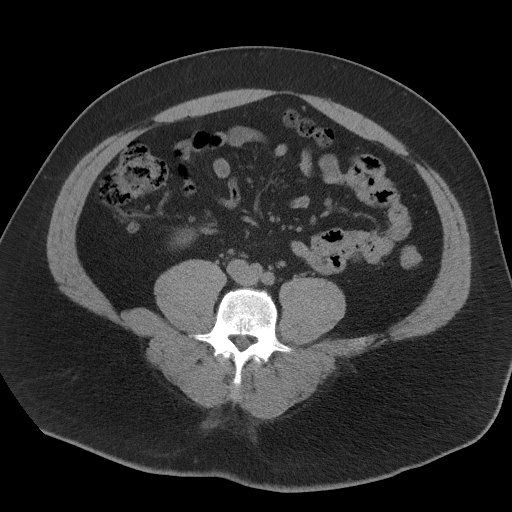
[im 56/93  soft-tissue]
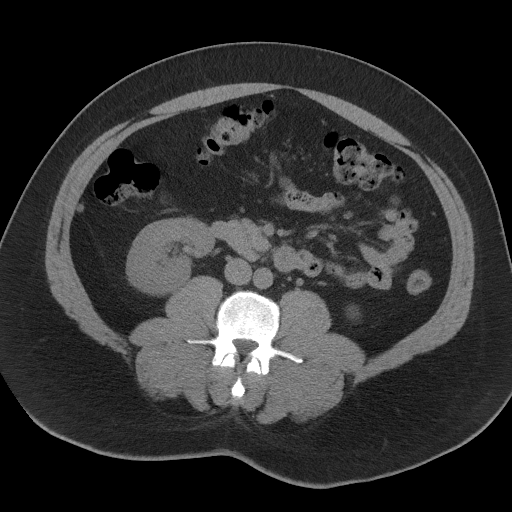
[im 56/93  bone]
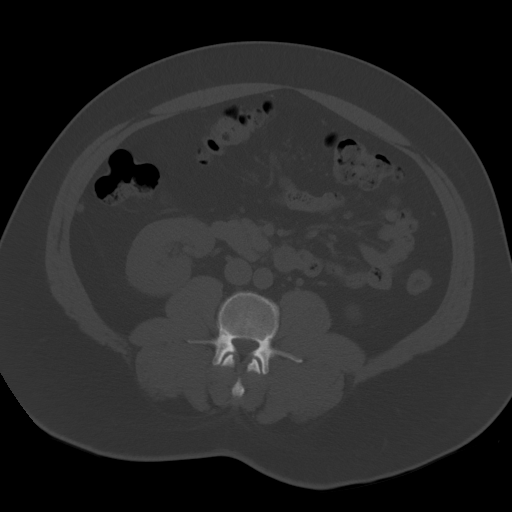
[im 63/93  soft-tissue]
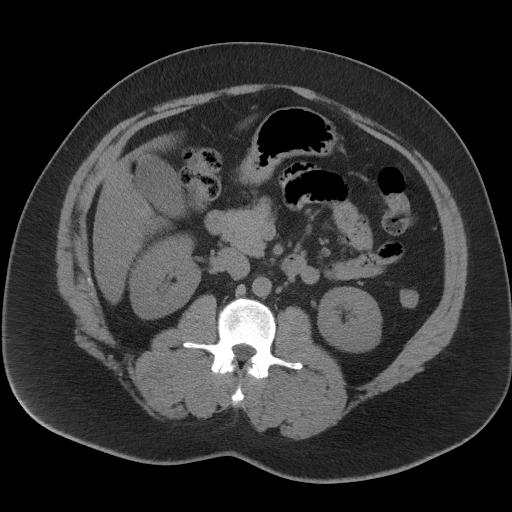
[im 70/93  soft-tissue]
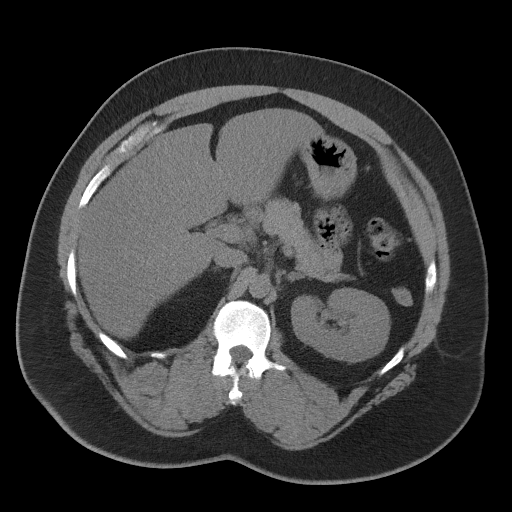
[im 74/93  soft-tissue]
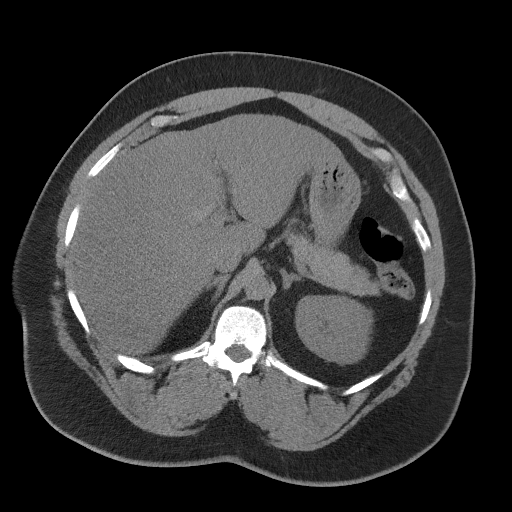
[im 81/93  soft-tissue]
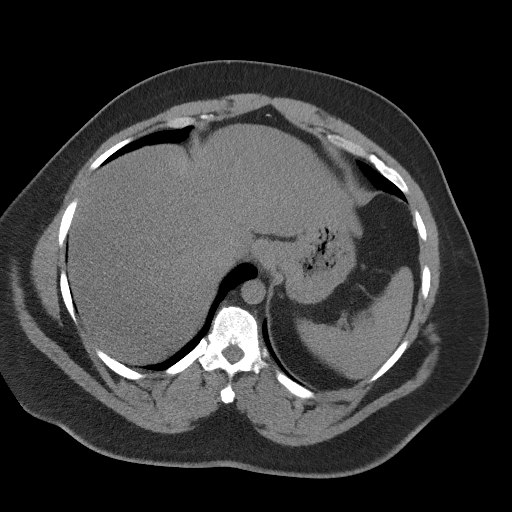
[im 89/93  soft-tissue]
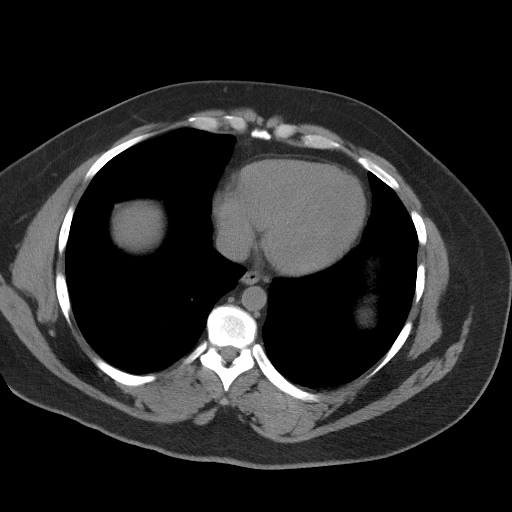

[Series 5: coronal · coronal · 0.84mm/px · 3 of 180 slices shown]
[im 60/180  soft-tissue]
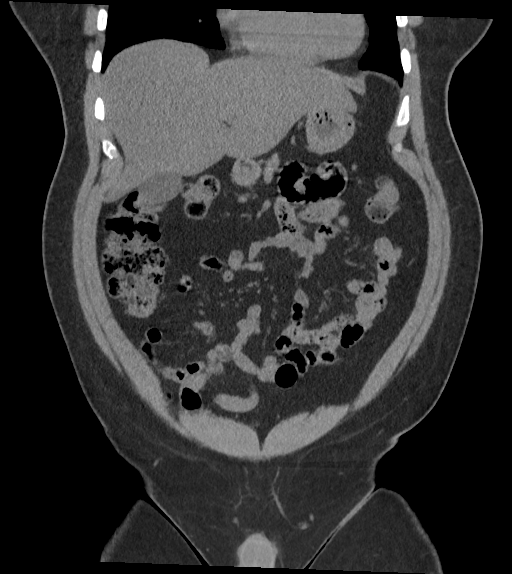
[im 80/180  soft-tissue]
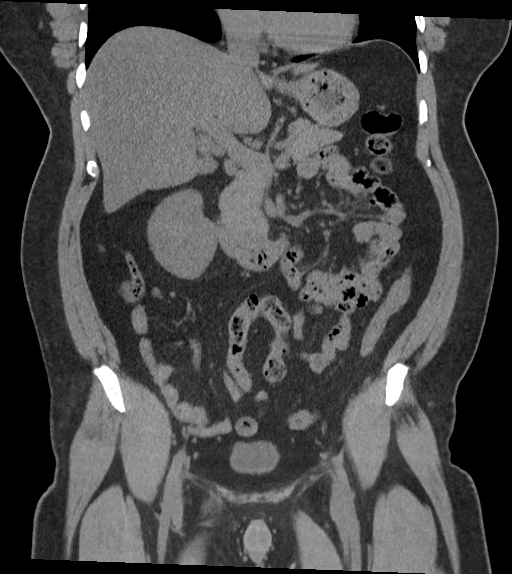
[im 100/180  soft-tissue]
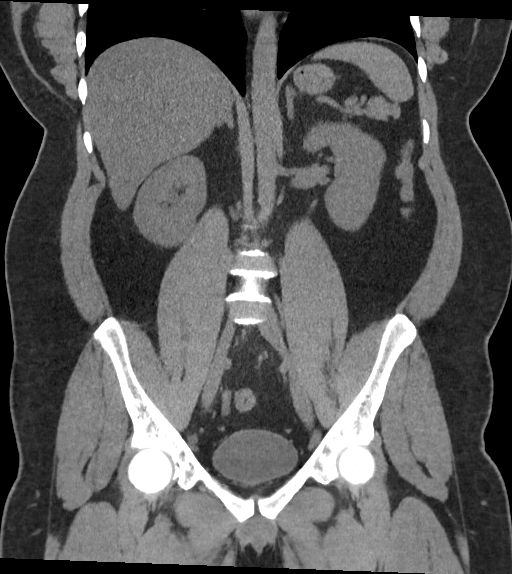

[17 of 46 positions shown; findings below may reference images not displayed]

FINDINGS: Lower chest: No acute abnormality.

Hepatobiliary: No gallstones or biliary dilatation is noted. Hepatic
steatosis is noted.

Pancreas: Unremarkable. No pancreatic ductal dilatation or
surrounding inflammatory changes.

Spleen: Normal in size without focal abnormality.

Adrenals/Urinary Tract: Adrenal glands are unremarkable. Kidneys are
normal, without renal calculi, focal lesion, or hydronephrosis.
Bladder is unremarkable.

Stomach/Bowel: Stomach is within normal limits. Appendix appears
normal. No evidence of bowel wall thickening, distention, or
inflammatory changes.

Vascular/Lymphatic: No significant vascular findings are present. No
enlarged abdominal or pelvic lymph nodes.

Reproductive: Prostate is unremarkable.

Other: No abdominal wall hernia or abnormality. No abdominopelvic
ascites.

Musculoskeletal: No acute or significant osseous findings.
IMPRESSION: Hepatic steatosis.

No other abnormality seen in the abdomen or pelvis.

## 2023-11-08 NOTE — Telephone Encounter (Signed)
 Spoke with [facility] per request of patient to find out if there is a plan limit on foam sclerotherapy. Per Birdena Crandall On 3.5.25 - no prior auth required and there are no restrictions on plan limit. 1 unit per date of service may be performed. Patient is covered at 80% after deductible is met. Patient has currently met deductible so he would pay 20% of cost. Reference # [id number].

## 2023-11-20 ENCOUNTER — Other Ambulatory Visit: Payer: Self-pay | Admitting: Internal Medicine

## 2023-11-20 DIAGNOSIS — R079 Chest pain, unspecified: Secondary | ICD-10-CM

## 2023-11-27 ENCOUNTER — Encounter (INDEPENDENT_AMBULATORY_CARE_PROVIDER_SITE_OTHER): Payer: Self-pay | Admitting: Vascular Surgery

## 2023-11-29 ENCOUNTER — Ambulatory Visit
Admission: RE | Admit: 2023-11-29 | Discharge: 2023-11-29 | Disposition: A | Discharge status: Home / Self Care | Payer: Self-pay | Source: Ambulatory Visit | Attending: Internal Medicine | Admitting: Internal Medicine

## 2023-11-29 DIAGNOSIS — R079 Chest pain, unspecified: Secondary | ICD-10-CM | POA: Insufficient documentation

## 2023-12-03 NOTE — Progress Notes (Unsigned)
   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  Procedure:  Sclerotherapy using SDS foam mixed with 1% Lidocaine was performed on the left lower extremity.  Ultrasound was used to identify the varicosity which was echolucent and compressible indicating patency.  Access with 21-gauge needle was made under direct ultrasound visualization.  Compression wraps were placed.  The patient tolerated the procedure well.  Plan: Of note following his previous injection sclerotherapy he has developed moderate amount of phlebitis consistent with successful sclerotherapy of the large varicosities that were present.  On examination really I only found 1 residual varicose vein that was coursing posteriorly toward the popliteal fossa.  We discussed using nonsteroidal anti-inflammatories, continued compression, elevation and ice compresses.  I also gave him encouragement that this is a good thing and will resolve although it does always seem to take longer than people wish it would.  This was all discussed with him.   Follow up as needed.  I do not see any significant residual varicosities remaining at this point

## 2023-12-04 ENCOUNTER — Ambulatory Visit (INDEPENDENT_AMBULATORY_CARE_PROVIDER_SITE_OTHER): Admitting: Vascular Surgery

## 2023-12-04 ENCOUNTER — Encounter (INDEPENDENT_AMBULATORY_CARE_PROVIDER_SITE_OTHER): Payer: Self-pay | Admitting: Vascular Surgery

## 2023-12-04 VITALS — BP 137/84 | HR 66 | Resp 18

## 2023-12-04 DIAGNOSIS — I83813 Varicose veins of bilateral lower extremities with pain: Secondary | ICD-10-CM

## 2024-01-01 ENCOUNTER — Ambulatory Visit
Admission: RE | Admit: 2024-01-01 | Discharge: 2024-01-01 | Disposition: A | Discharge status: Home / Self Care | Source: Ambulatory Visit | Attending: Neurology | Admitting: Neurology

## 2024-01-01 ENCOUNTER — Other Ambulatory Visit: Payer: Self-pay | Admitting: Neurology

## 2024-01-01 DIAGNOSIS — H93A9 Pulsatile tinnitus, unspecified ear: Secondary | ICD-10-CM | POA: Insufficient documentation

## 2024-01-23 ENCOUNTER — Encounter (INDEPENDENT_AMBULATORY_CARE_PROVIDER_SITE_OTHER): Payer: Self-pay

## 2024-02-06 ENCOUNTER — Ambulatory Visit (INDEPENDENT_AMBULATORY_CARE_PROVIDER_SITE_OTHER): Payer: 59 | Admitting: Podiatry

## 2024-02-06 DIAGNOSIS — Q666 Other congenital valgus deformities of feet: Secondary | ICD-10-CM | POA: Diagnosis not present

## 2024-02-06 NOTE — Progress Notes (Signed)
  Subjective:  Patient ID: Jesus Travis, male    DOB: 29-Mar-1976,  MRN: [id number]  Chief Complaint  Patient presents with   Superinfection    Pt stated that his feet are doing better     48 y.o. male presents with the above complaint.  Patient presents with follow-up of superinfection which is completely healed he also wants to get evaluated make sure his orthotics and everything is functioning well and get his foot checked.  Denies any other acute close is diabetic last A1c of 6.1%.   Review of Systems: Negative except as noted in the HPI. Denies N/V/F/Ch.  Past Medical History:  Diagnosis Date   CVA (cerebral infarction) 09/05/2009   mild    Diabetes mellitus without complication (HCC)    Hypertension    Sleep apnea     Current Outpatient Medications:    Castellani Paint 1.5 % LIQD, Apply 1 application  topically daily., Disp: 29.57 mL, Rfl: 0   Erythromycin  2 % PADS, Apply 1 Application topically daily., Disp: 60 each, Rfl: 0   SODIUM FLUORIDE, DENTAL RINSE, 0.2 % SOLN, Take 0.5 Capfuls by mouth once a week., Disp: , Rfl:    terbinafine  (LAMISIL ) 250 MG tablet, Take 1 tablet (250 mg total) by mouth daily., Disp: 30 tablet, Rfl: 0   Vitamin D , Ergocalciferol , (DRISDOL ) 1.25 MG (50000 UT) CAPS capsule, Take 50,000 Units by mouth every Friday., Disp: , Rfl:   Social History   Tobacco Use  Smoking Status Never  Smokeless Tobacco Never    Allergies  Allergen Reactions   Pork-Derived Products Other (See Comments)    "Pt states he feels bad"   Objective:  There were no vitals filed for this visit. There is no height or weight on file to calculate BMI. Constitutional Well developed. Well nourished.  Vascular Dorsalis pedis pulses palpable bilaterally. Posterior tibial pulses palpable bilaterally. Capillary refill normal to all digits.  No cyanosis or clubbing noted. Pedal hair growth normal.  Neurologic Normal speech. Oriented to person, place, and  time. Epicritic sensation to light touch grossly present bilaterally.  Dermatologic Nails well groomed and normal in appearance. No open wounds. No skin lesions.  Orthopedic: Gait examination shows pes planovalgus foot structure with calcaneovalgus to many toe signs partially recurred the arch with dorsiflexion of the hallux.  Unable to perform single and double heel raise   Radiographs: None Assessment:   1. Superinfection   2. Pes planovalgus      Plan:  Patient was evaluated and treated and all questions answered.  Left fourth interspace superinfection - Clinically healed Pes planovalgus -I explained to patient the etiology of pes planovalgus and relationship with Planter fasciitis and various treatment options were discussed.  Given patient foot structure in the setting of Planter fasciitis I believe patient will benefit from custom-made orthotics to help control the hindfoot motion support the arch of the foot and take the stress away from plantar fascial.  Patient agrees with the plan like to proceed with orthotics - Orthotics were dispensed and they are functioning well.  No acute complaints   No follow-ups on file.

## 2024-03-26 ENCOUNTER — Ambulatory Visit: Admitting: Dietician

## 2024-04-01 ENCOUNTER — Encounter: Payer: Self-pay | Admitting: Dietician

## 2024-04-01 ENCOUNTER — Encounter: Payer: Self-pay | Attending: Family Medicine | Admitting: Dietician

## 2024-04-01 VITALS — Ht 71.0 in | Wt 173.1 lb

## 2024-04-01 DIAGNOSIS — G35 Multiple sclerosis: Secondary | ICD-10-CM | POA: Diagnosis not present

## 2024-04-01 DIAGNOSIS — R634 Abnormal weight loss: Secondary | ICD-10-CM | POA: Insufficient documentation

## 2024-04-01 DIAGNOSIS — E119 Type 2 diabetes mellitus without complications: Secondary | ICD-10-CM

## 2024-04-01 DIAGNOSIS — Z713 Dietary counseling and surveillance: Secondary | ICD-10-CM | POA: Diagnosis not present

## 2024-04-01 DIAGNOSIS — Z6824 Body mass index (BMI) 24.0-24.9, adult: Secondary | ICD-10-CM | POA: Insufficient documentation

## 2024-04-01 NOTE — Patient Instructions (Signed)
 Include at least 15-20g of carbs with each meal or snack daily; gradually increase towards 45grams with each meal while staying at 15-20g for snacks. Remember some carbs with each meal will provide better energy for exercise, and will help the body with routine intake of carbs.  Fasting blood sugars can be affected by inadequate sleep, waking up during the night, possibly by late night snacking, stress, pain.  Avoid losing below 165lbs to avoid decreased metabolism, decreased strength/ energy, nutrition deficiencies.

## 2024-04-01 NOTE — Progress Notes (Signed)
 Medical Nutrition Therapy: Visit start time: 1500  end time: 1600  Assessment:   Referral Diagnosis: abnormal weight loss, MS Other medical history/ diagnoses: diabetes Psychosocial issues/ stress concerns: none  Medications, supplements: reconciled list in medical record   Current weight: 173.1lbs Height: 5'11 BMI: 24.14 Patient's personal weight goal: 165-170lbs  Progress and evaluation:  Patient reports intentional weight loss as he is working to weight goal of 165-170 which would be BMI of 23. Recent HbA1C of 6.1% (01/17/24) He has kept records of blood sugar readings, food intake for about 2 years since attending diabetes group education. Patient reports goal of fasting blood sugars <100. He reports avoiding multiple foods to avoid effect on blood sugar -- including potatoes, pasta, other starches; strictly avoids all sweets and sources of sugar. He feels his nighttime snacks are preventing further weight loss and leading to some glucose readings >100. Food allergies: pork Patient seeks help with maintaining healthy body weight (goal of 165-170)    Dietary Intake:  Usual eating pattern includes 3 meals and 3-4 snacks per day. Dining out frequency:  Who plans meals/ buys groceries? self Who prepares meals? self  Breakfast: graham cracker (1 sheet) with crunchy peanut butter; Fast Break snack pack -- cheese, dry cereal, nuts Snack: 1/4c peanuts Lunch: 7/28 brisket sandwich, no sides;  Snack: apple Snack: yogurt Supper: 2 chicken breast pieces, broccoli and cheese, sometimes small portion pasta Snack: before bedtime -- chips ie veggie straws doritos Beverages: water aims for avg 73oz daily, zero sugar energy drink in am  Physical activity: run 1-2 miles, walk 3 miles during break at work   Intervention:   Nutrition Care Education:   Basic nutrition: reviewed appropriate nutrient balance; appropriate meal and snack schedule; general nutrition guidelines    Weight control:  identifying healthy weight; determining reasonable weight loss rate of 1-2lbs per month when close to goal; avoidance of underweight and under-eating; estimated energy needs for weight maintenance/ slow loss at 1800 kcal, provided guidance for 40% CHO, 30% protein (30% fat) Diabetes:  goals for BGs; appropriate meal and snack schedule; appropriate carb intake and balance, healthy carb choices; role of fiber, protein, fat; role of physical activity and role of carbohydrate in fueling exercise; effects of stress  Other intervention notes: Patient might be over-restricting carbohydrate intake in meals in order to achieve calorie deficit.  Advised gradual increase in carb intake, consistently throughout the day, to avoid excessive nighttime hunger and meet nutritional needs.  Established goals with input from patient. No follow up scheduled at this time; patient to schedule later as needed.   Nutritional Diagnosis:  [place]-2.1 Inpaired nutrition utilization and [place]-2.2 Altered nutrition-related laboratory As related to Type 2 diabetes.  As evidenced by elevated HbA1C. [place]-5.5 Imbalance of nutrients As related to low carbohydrate intake.  As evidenced by frequent low-carb meals with less than 15g carbohydrate and less than 130g daily.   Education Materials given:  [employer] guidelines for Diabetes Designer, industrial/product with food lists, sample meal pattern Sample menus Snacking handout Visit summary with goals/ instructions   Learner/ who was taught:  Patient   Level of understanding: Verbalizes/ demonstrates competency  Demonstrated degree of understanding via:   Teach back Learning barriers: None  Willingness to learn/ readiness for change: Eager, change in progress  Monitoring and Evaluation:  Dietary intake, exercise, and body weight      follow up: prn

## 2024-04-08 ENCOUNTER — Ambulatory Visit: Payer: Self-pay | Admitting: Psychiatry

## 2025-02-04 ENCOUNTER — Ambulatory Visit: Admitting: Podiatry
